# Patient Record
Sex: Female | Born: 1990 | Race: Black or African American | Hispanic: No | Marital: Single | Smoking: Never smoker
Health system: Southern US, Community
[De-identification: ages and names within clinical notes are randomized; demographics above are authoritative.]

## PROBLEM LIST (undated history)

## (undated) DIAGNOSIS — O139 Gestational [pregnancy-induced] hypertension without significant proteinuria, unspecified trimester: Secondary | ICD-10-CM

## (undated) DIAGNOSIS — D649 Anemia, unspecified: Secondary | ICD-10-CM

## (undated) HISTORY — PX: NO PAST SURGERIES: SHX2092

## (undated) HISTORY — DX: Gestational (pregnancy-induced) hypertension without significant proteinuria, unspecified trimester: O13.9

---

## 2018-01-18 ENCOUNTER — Ambulatory Visit (HOSPITAL_COMMUNITY)
Admission: EM | Admit: 2018-01-18 | Discharge: 2018-01-18 | Disposition: A | Discharge status: Home / Self Care | Payer: Self-pay | Attending: Family Medicine | Admitting: Family Medicine

## 2018-01-18 ENCOUNTER — Encounter (HOSPITAL_COMMUNITY): Payer: Self-pay | Admitting: Family Medicine

## 2018-01-18 DIAGNOSIS — K0889 Other specified disorders of teeth and supporting structures: Secondary | ICD-10-CM

## 2018-01-18 HISTORY — DX: Anemia, unspecified: D64.9

## 2018-01-18 MED ORDER — AMOXICILLIN 875 MG PO TABS: 875.0000 mg | ORAL_TABLET | Freq: Two times a day (BID) | ORAL | 0 refills | Status: AC

## 2018-01-18 NOTE — Discharge Instructions (Signed)
You may use over the counter ibuprofen or acetaminophen as needed.  ° °

## 2018-01-18 NOTE — ED Triage Notes (Signed)
Pt here for dental pain and oral swelling.

## 2018-01-23 NOTE — ED Provider Notes (Signed)
  [facility]   [id number] 01/18/18 Arrival Time: 1627  ASSESSMENT & PLAN:  1. Pain, dental     Meds ordered this encounter  Medications  . amoxicillin (AMOXIL) 875 MG tablet    Sig: Take 1 tablet (875 mg total) by mouth 2 (two) times daily for 10 days.    Dispense:  20 tablet    Refill:  0   Prefers OTC analgesics. Dental resource written instructions given. She will schedule dental evaluation as soon as possible.  Reviewed expectations re: course of current medical issues. Questions answered. Outlined signs and symptoms indicating need for more acute intervention. Patient verbalized understanding. After Visit Summary given.   SUBJECTIVE:  Courtney Lyons is a 27 y.o. female who reports gradual onset of left lower dental pain described as throbbing. Present for several days. Afebrile. Tolerating PO intake but reports pain with chewing. Normal swallowing. She does not see a dentist regularly. No neck swelling or pain. OTC analgesics without relief.  ROS: As per HPI.  OBJECTIVE:  Vitals:   01/18/18 1645 01/18/18 1646  BP:  135/83  Pulse: 90   Resp: 18   Temp: 98 F (36.7 C)   TempSrc: Oral   SpO2: 100%     General appearance: alert; no distress HENT: normocephalic; atraumatic; dentition: fair; gingival hypertrophy over left lower gums without areas of fluctuance; tender to touch though; dental caries Neck: supple without LAD Lungs: normal respirations Skin: warm and dry Psychological: alert and cooperative; normal mood and affect  No Known Allergies  Past Medical History:  Diagnosis Date  . Anemia    Social History   Socioeconomic History  . Marital status: Single    Spouse name: Not on file  . Number of children: Not on file  . Years of education: Not on file  . Highest education level: Not on file  Occupational History  . Not on file  Social Needs  . Financial resource strain: Not on file  . Food insecurity:    Worry: Not on file   Inability: Not on file  . Transportation needs:    Medical: Not on file    Non-medical: Not on file  Tobacco Use  . Smoking status: Never Smoker  Substance and Sexual Activity  . Alcohol use: Not on file  . Drug use: Not on file  . Sexual activity: Not on file  Lifestyle  . Physical activity:    Days per week: Not on file    Minutes per session: Not on file  . Stress: Not on file  Relationships  . Social connections:    Talks on phone: Not on file    Gets together: Not on file    Attends religious service: Not on file    Active member of club or organization: Not on file    Attends meetings of clubs or organizations: Not on file    Relationship status: Not on file  . Intimate partner violence:    Fear of current or ex partner: Not on file    Emotionally abused: Not on file    Physically abused: Not on file    Forced sexual activity: Not on file  Other Topics Concern  . Not on file  Social History Narrative  . Not on file   History reviewed. No pertinent family history. History reviewed. No pertinent surgical history.   Courtney Lyons, Courtney Mccormack, MD 01/23/18 [phone]

## 2018-04-30 ENCOUNTER — Encounter (HOSPITAL_COMMUNITY): Payer: Self-pay | Admitting: Emergency Medicine

## 2018-04-30 ENCOUNTER — Ambulatory Visit (HOSPITAL_COMMUNITY)
Admission: EM | Admit: 2018-04-30 | Discharge: 2018-04-30 | Disposition: A | Discharge status: Home / Self Care | Payer: Medicaid Other | Attending: Family Medicine | Admitting: Family Medicine

## 2018-04-30 ENCOUNTER — Other Ambulatory Visit: Payer: Self-pay

## 2018-04-30 DIAGNOSIS — J020 Streptococcal pharyngitis: Secondary | ICD-10-CM | POA: Diagnosis not present

## 2018-04-30 LAB — POCT RAPID STREP A: Streptococcus, Group A Screen (Direct): POSITIVE — AB

## 2018-04-30 MED ORDER — IPRATROPIUM BROMIDE 0.06 % NA SOLN: 2.0000 | Freq: Four times a day (QID) | NASAL | 0 refills | Status: AC

## 2018-04-30 MED ORDER — FLUTICASONE PROPIONATE 50 MCG/ACT NA SUSP: 2.0000 | Freq: Every day | NASAL | 0 refills | Status: AC

## 2018-04-30 MED ORDER — PENICILLIN G BENZATHINE 1200000 UNIT/2ML IM SUSP
INTRAMUSCULAR | Status: AC
Start: 2018-04-30 — End: ?
  Filled 2018-04-30: qty 2

## 2018-04-30 MED ORDER — LIDOCAINE VISCOUS HCL 2 % MT SOLN: 15.0000 mL | OROMUCOSAL | 0 refills | Status: DC | PRN

## 2018-04-30 MED ORDER — PENICILLIN G BENZATHINE 1200000 UNIT/2ML IM SUSP
1.2000 10*6.[IU] | Freq: Once | INTRAMUSCULAR | Status: AC
Start: 2018-04-30 — End: 2018-04-30
  Administered 2018-04-30: 1.2 10*6.[IU] via INTRAMUSCULAR

## 2018-04-30 NOTE — Discharge Instructions (Addendum)
Rapid strep positive. Bicillin injection in office today. Start lidocaine for sore throat, do not eat or drink for the next 40 mins after use as it can stunt your gag reflex. If you are having nasal congestion/drainage, can try Flonase, atrovent. You can use over the counter nasal saline rinse such as neti pot for nasal congestion. Monitor for any worsening of symptoms, swelling of the throat, trouble breathing, trouble swallowing, leaning forward to breath, drooling, go to the emergency department for further evaluation needed.

## 2018-04-30 NOTE — ED Provider Notes (Signed)
[facility]    CSN: [id number] Arrival date & time: 04/30/18  4098     History   Chief Complaint Chief Complaint  Patient presents with  . Sore Throat    HPI Berenice Sacra is a 27 y.o. female.   27 year old female comes in for 2-day history of sore throat.  States had similar symptoms 1.5 weeks ago with sore throat, rhinorrhea, nasal congestion, cough, body aches.  At that time tried home remedies including tea, soup, ice, gurgling with hydrogen peroxide with good relief. All symptoms resolved 4 days ago prior to current symptom onset.  Denies current rhinorrhea, nasal congestion, cough.  Denies fever, chills, night sweats.  States sore throat has been more painful, having trouble eating and drinking due to the pain.  Denies tripoding, drooling, trismus.  States tonsils at baseline are large, but has felt more swollen than usual.  Denies abdominal pain, nausea, vomiting.  Tried aspirin without relief.     Past Medical History:  Diagnosis Date  . Anemia     There are no active problems to display for this patient.   History reviewed. No pertinent surgical history.  OB History   None      Home Medications    Prior to Admission medications   Medication Sig Start Date End Date Taking? Authorizing Provider  aspirin 325 MG tablet Take 325 mg by mouth daily.   Yes [provider]  fluticasone (FLONASE) 50 MCG/ACT nasal spray Place 2 sprays into both nostrils daily. 04/30/18   Cathie Hoops, Camillia Marcy V, PA-C  ipratropium (ATROVENT) 0.06 % nasal spray Place 2 sprays into both nostrils 4 (four) times daily. 04/30/18   Cathie Hoops, Bravlio Luca V, PA-C  lidocaine (XYLOCAINE) 2 % solution Use as directed 15 mLs in the mouth or throat as needed for mouth pain. 04/30/18   Belinda Fisher, PA-C    Family History Family History  Problem Relation Age of Onset  . Hypertension Mother   . Diabetes Father     Social History Social History   Tobacco Use  . Smoking status: Never Smoker  Substance Use  Topics  . Alcohol use: Yes  . Drug use: Yes    Types: Marijuana     Allergies   Patient has no known allergies.   Review of Systems Review of Systems  Reason unable to perform ROS: See HPI as above.     Physical Exam Triage Vital Signs ED Triage Vitals  Enc Vitals Group     BP 04/30/18 0949 109/82     Pulse Rate 04/30/18 0949 78     Resp 04/30/18 0949 16     Temp 04/30/18 0949 98.2 F (36.8 C)     Temp Source 04/30/18 0949 Oral     SpO2 04/30/18 0949 100 %     Weight --      Height --      Head Circumference --      Peak Flow --      Pain Score 04/30/18 0956 10     Pain Loc --      Pain Edu? --      Excl. in GC? --    No data found.  Updated Vital Signs BP 109/82 (BP Location: Right Arm)   Pulse 78   Temp 98.2 F (36.8 C) (Oral)   Resp 16   LMP 04/22/2018   SpO2 100%   Physical Exam  Constitutional: She is oriented to person, place, and time. She appears well-developed and  well-nourished.  Non-toxic appearance. She does not appear ill. No distress.  HENT:  Head: Normocephalic and atraumatic.  Right Ear: Tympanic membrane, external ear and ear canal normal. Tympanic membrane is not erythematous and not bulging.  Left Ear: Tympanic membrane, external ear and ear canal normal. Tympanic membrane is not erythematous and not bulging.  Nose: Nose normal. Right sinus exhibits no maxillary sinus tenderness and no frontal sinus tenderness. Left sinus exhibits no maxillary sinus tenderness and no frontal sinus tenderness.  Mouth/Throat: Uvula is midline, oropharynx is clear and moist and mucous membranes are normal. Tonsils are 3+ on the right. Tonsils are 3+ on the left. No tonsillar exudate.  Eyes: Pupils are equal, round, and reactive to light. Conjunctivae are normal.  Neck: Normal range of motion. Neck supple.  Cardiovascular: Normal rate, regular rhythm and normal heart sounds. Exam reveals no gallop and no friction rub.  No murmur heard. Pulmonary/Chest: Effort  normal and breath sounds normal. She has no decreased breath sounds. She has no wheezes. She has no rhonchi. She has no rales.  Lymphadenopathy:    She has no cervical adenopathy.  Neurological: She is alert and oriented to person, place, and time.  Skin: Skin is warm and dry.  Psychiatric: She has a normal mood and affect. Her behavior is normal. Judgment normal.     UC Treatments / Results  Labs (all labs ordered are listed, but only abnormal results are displayed) Labs Reviewed  POCT RAPID STREP A - Abnormal; Notable for the following components:      Result Value   Streptococcus, Group A Screen (Direct) POSITIVE (*)    All other components within normal limits    EKG None  Radiology No results found.  Procedures Procedures (including critical care time)  Medications Ordered in UC Medications  penicillin g benzathine (BICILLIN LA) 1200000 UNIT/2ML injection 1.2 Million Units (1.2 Million Units Intramuscular Given 04/30/18 1124)    Initial Impression / Assessment and Plan / UC Course  I have reviewed the triage vital signs and the nursing notes.  Pertinent labs & imaging results that were available during my care of the patient were reviewed by me and considered in my medical decision making (see chart for details).    Rapid strep positive. Given appearance of tonsils, offered bicillin injection, which paitient would like to proceed vs PO abx. Symptomatic treatment as needed. Return precautions given.   Final Clinical Impressions(s) / UC Diagnoses   Final diagnoses:  Strep pharyngitis    ED Prescriptions    Medication Sig Dispense Auth. Provider   lidocaine (XYLOCAINE) 2 % solution Use as directed 15 mLs in the mouth or throat as needed for mouth pain. 100 mL Stormy Connon V, PA-C   fluticasone (FLONASE) 50 MCG/ACT nasal spray Place 2 sprays into both nostrils daily. 1 g Fern Asmar V, PA-C   ipratropium (ATROVENT) 0.06 % nasal spray Place 2 sprays into both nostrils 4  (four) times daily. 15 mL Threasa AlphaYu, Zelig Gacek V, PA-C        Natalina Wieting V, [place] JerseyPA-C 04/30/18 1210

## 2018-04-30 NOTE — ED Triage Notes (Signed)
Sore throat started 1 1/2 ago.  Sore throat went away.  Then sore throat returned 2 days ago.  Denies cough, cold runny nose today.  Says she does not feel sick today, just throat hurts

## 2018-08-20 ENCOUNTER — Encounter (HOSPITAL_COMMUNITY): Payer: Self-pay | Admitting: Emergency Medicine

## 2018-08-20 ENCOUNTER — Emergency Department (HOSPITAL_COMMUNITY)
Admission: EM | Admit: 2018-08-20 | Discharge: 2018-08-20 | Disposition: A | Discharge status: Home / Self Care | Payer: Medicaid Other | Attending: Emergency Medicine | Admitting: Emergency Medicine

## 2018-08-20 ENCOUNTER — Ambulatory Visit (HOSPITAL_COMMUNITY): Payer: Medicaid Other

## 2018-08-20 DIAGNOSIS — O26891 Other specified pregnancy related conditions, first trimester: Secondary | ICD-10-CM | POA: Diagnosis not present

## 2018-08-20 DIAGNOSIS — R102 Pelvic and perineal pain: Secondary | ICD-10-CM | POA: Insufficient documentation

## 2018-08-20 DIAGNOSIS — Z7982 Long term (current) use of aspirin: Secondary | ICD-10-CM | POA: Diagnosis not present

## 2018-08-20 DIAGNOSIS — Z3A13 13 weeks gestation of pregnancy: Secondary | ICD-10-CM | POA: Diagnosis not present

## 2018-08-20 DIAGNOSIS — Z79899 Other long term (current) drug therapy: Secondary | ICD-10-CM | POA: Diagnosis not present

## 2018-08-20 LAB — WET PREP, GENITAL
Sperm: NONE SEEN
Trich, Wet Prep: NONE SEEN
Yeast Wet Prep HPF POC: NONE SEEN

## 2018-08-20 LAB — I-STAT BETA HCG BLOOD, ED (MC, WL, AP ONLY): I-stat hCG, quantitative: >2000 m[IU]/mL — ABNORMAL HIGH (ref <5)

## 2018-08-20 MED ORDER — PRENATAL COMPLETE 14-0.4 MG PO TABS: 1.0000 | ORAL_TABLET | Freq: Every day | ORAL | 0 refills | Status: DC

## 2018-08-20 NOTE — ED Triage Notes (Signed)
Patient to ER for evaluation of abdominal pain in pregnancy, reports she is pregnant, not sure how far along, LMP was August 6th.

## 2018-08-20 NOTE — ED Notes (Signed)
ED Provider at bedside. 

## 2018-08-20 NOTE — ED Notes (Signed)
Patient transported to US 

## 2018-08-20 NOTE — Discharge Instructions (Addendum)
Please read attached information. If you experience any new or worsening signs or symptoms please return to the emergency room for evaluation. Please follow-up with your primary care provider or specialist as discussed. Please use medication prescribed only as directed and discontinue taking if you have any concerning signs or symptoms.   °

## 2018-08-20 NOTE — ED Provider Notes (Signed)
[facility] EMERGENCY DEPARTMENT Provider Note   CSN: [id number] Arrival date & time: 08/20/18  1251   History   Chief Complaint Chief Complaint  Patient presents with  . Abdominal Pain    HPI Courtney Lyons is a 27 y.o. female.  HPI   27 YOF G17 P2 A15 femeale presents with complaints of pregnancy and pelvic pain.  Patient notes that her last menstrual cycle was on 05/22/2018.  She notes pain today in the bilateral lower pelvis, sharp in nature.  She denies any vaginal bleeding or discharge.  She denies any fever.  Past Medical History:  Diagnosis Date  . Anemia     There are no active problems to display for this patient.   History reviewed. No pertinent surgical history.   OB History   None      Home Medications    Prior to Admission medications   Medication Sig Start Date End Date Taking? Authorizing Provider  aspirin 325 MG tablet Take 325 mg by mouth daily.    [provider]  fluticasone (FLONASE) 50 MCG/ACT nasal spray Place 2 sprays into both nostrils daily. 04/30/18   Cathie Hoops, Amy V, PA-C  ipratropium (ATROVENT) 0.06 % nasal spray Place 2 sprays into both nostrils 4 (four) times daily. 04/30/18   Cathie Hoops, Amy V, PA-C  lidocaine (XYLOCAINE) 2 % solution Use as directed 15 mLs in the mouth or throat as needed for mouth pain. 04/30/18   Belinda Fisher, PA-C  Prenatal Vit-Fe Fumarate-FA (PRENATAL COMPLETE) 14-0.4 MG TABS Take 1 tablet by mouth daily. 08/20/18   Eyvonne Mechanic, PA-C    Family History Family History  Problem Relation Age of Onset  . Hypertension Mother   . Diabetes Father     Social History Social History   Tobacco Use  . Smoking status: Never Smoker  . Smokeless tobacco: Never Used  Substance Use Topics  . Alcohol use: Yes  . Drug use: Yes    Types: Marijuana     Allergies   Patient has no known allergies.   Review of Systems Review of Systems  All other systems reviewed and are negative.  Physical Exam Updated  Vital Signs BP (!) 145/68 (BP Location: Right Arm)   Pulse 79   Temp 99.5 F (37.5 C)   Resp 18   LMP 05/22/2018   SpO2 100%   Physical Exam  Constitutional: She is oriented to person, place, and time. She appears well-developed and well-nourished.  HENT:  Head: Normocephalic and atraumatic.  Eyes: Pupils are equal, round, and reactive to light. Conjunctivae are normal. Right eye exhibits no discharge. Left eye exhibits no discharge. No scleral icterus.  Neck: Normal range of motion. No JVD present. No tracheal deviation present.  Pulmonary/Chest: Effort normal. No stridor.  Abdominal: Soft. She exhibits no distension. There is no tenderness. There is no guarding.  Genitourinary:  Genitourinary Comments: No discharge noted in vaginal vault-cervical os closed  Neurological: She is alert and oriented to person, place, and time. Coordination normal.  Psychiatric: She has a normal mood and affect. Her behavior is normal. Judgment and thought content normal.  Nursing note and vitals reviewed.    ED Treatments / Results  Labs (all labs ordered are listed, but only abnormal results are displayed) Labs Reviewed  WET PREP, GENITAL - Abnormal; Notable for the following components:      Result Value   Clue Cells Wet Prep HPF POC PRESENT (*)    WBC, Wet Prep  HPF POC MANY (*)    All other components within normal limits  I-STAT BETA HCG BLOOD, ED ([facility], [facility], [facility] ONLY) - Abnormal; Notable for the following components:   I-stat hCG, quantitative >2,000.0 (*)    All other components within normal limits  GC/CHLAMYDIA PROBE AMP () NOT AT [facility]    EKG None  Radiology US Ob Comp < 14 Wks  Result Date: 08/20/2018 CLINICAL DATA:  Initial evaluation for acute pelvic pain, early pregnancy. EXAM: OBSTETRIC <14 WK [place] AND TRANSVAGINAL OB US TECHNIQUE: Both transabdominal and transvaginal ultrasound examinations were performed for complete evaluation of the gestation as well as the maternal  uterus, adnexal regions, and pelvic cul-de-sac. Transvaginal technique was performed to assess early pregnancy. COMPARISON:  None. FINDINGS: Intrauterine gestational sac: Single Yolk sac:  Present Embryo:  Present Cardiac Activity: Present Heart Rate: 163 bpm CRL: 68 mm   13 w   0 d                  [place] EDC: 02/25/2019 Subchorionic hemorrhage:  None visualized. Maternal uterus/adnexae: Ovaries are normal in appearance bilaterally. No adnexal mass. No free fluid identified. IMPRESSION: 1. Single live intrauterine pregnancy as above without complication, estimated gestational age [redacted] weeks and 0 days by crown-rump length, with ultrasound EDC of 02/25/2019. 2. No other acute maternal uterine or adnexal abnormality identified. Electronically Signed   By: Rise Mu M.D.   On: 08/20/2018 16:53   US Ob Transvaginal  Result Date: 08/20/2018 CLINICAL DATA:  Initial evaluation for acute pelvic pain, early pregnancy. EXAM: OBSTETRIC <14 WK [place] AND TRANSVAGINAL OB US TECHNIQUE: Both transabdominal and transvaginal ultrasound examinations were performed for complete evaluation of the gestation as well as the maternal uterus, adnexal regions, and pelvic cul-de-sac. Transvaginal technique was performed to assess early pregnancy. COMPARISON:  None. FINDINGS: Intrauterine gestational sac: Single Yolk sac:  Present Embryo:  Present Cardiac Activity: Present Heart Rate: 163 bpm CRL: 68 mm   13 w   0 d                  [place] EDC: 02/25/2019 Subchorionic hemorrhage:  None visualized. Maternal uterus/adnexae: Ovaries are normal in appearance bilaterally. No adnexal mass. No free fluid identified. IMPRESSION: 1. Single live intrauterine pregnancy as above without complication, estimated gestational age [redacted] weeks and 0 days by crown-rump length, with ultrasound EDC of 02/25/2019. 2. No other acute maternal uterine or adnexal abnormality identified. Electronically Signed   By: Rise Mu M.D.   On: 08/20/2018 16:53     Procedures Procedures (including critical care time)  Medications Ordered in ED Medications - No data to display   Initial Impression / Assessment and Plan / ED Course  I have reviewed the triage vital signs and the nursing notes.  Pertinent labs & imaging results that were available during my care of the patient were reviewed by me and considered in my medical decision making (see chart for details).     Labs: Wet prep, i-STAT beta-hCG  Imaging: Ultrasound OB complete  Consults:  Therapeutics:  Discharge Meds: Prenatal vitamins  Assessment/Plan: 27 year old female presents today with pregnancy at approximately 13 weeks.  No comp gating features.  Patient encouraged to follow-up closely with OB/GYN, strict return precautions given.  Verbalized understanding and agreement to today's plan had no further questions or concerns.   Final Clinical Impressions(s) / ED Diagnoses   Final diagnoses:  [redacted] weeks gestation of pregnancy    ED Discharge Orders  Ordered    Prenatal Vit-Fe Fumarate-FA (PRENATAL COMPLETE) 14-0.4 MG TABS  Daily     08/20/18 1807           Eyvonne Mechanic, PA-C 08/20/18 1812    Tilden Fossa, MD 08/21/18 1421

## 2018-08-21 LAB — GC/CHLAMYDIA PROBE AMP (~~LOC~~) NOT AT ARMC
Chlamydia: NEGATIVE
Neisseria Gonorrhea: POSITIVE — AB

## 2018-09-06 ENCOUNTER — Ambulatory Visit (INDEPENDENT_AMBULATORY_CARE_PROVIDER_SITE_OTHER): Payer: Medicaid Other | Admitting: Certified Nurse Midwife

## 2018-09-06 ENCOUNTER — Other Ambulatory Visit (HOSPITAL_COMMUNITY)
Admission: RE | Admit: 2018-09-06 | Discharge: 2018-09-06 | Disposition: A | Discharge status: Home / Self Care | Payer: Medicaid Other | Source: Ambulatory Visit | Attending: Obstetrics | Admitting: Obstetrics

## 2018-09-06 ENCOUNTER — Encounter: Payer: Self-pay | Admitting: Certified Nurse Midwife

## 2018-09-06 ENCOUNTER — Other Ambulatory Visit: Payer: Self-pay

## 2018-09-06 ENCOUNTER — Encounter: Payer: Medicaid Other | Admitting: Obstetrics

## 2018-09-06 VITALS — BP 125/73 | HR 80 | Temp 97.9°F | Ht 64.0 in | Wt 213.8 lb

## 2018-09-06 DIAGNOSIS — Z3480 Encounter for supervision of other normal pregnancy, unspecified trimester: Secondary | ICD-10-CM | POA: Diagnosis present

## 2018-09-06 DIAGNOSIS — B9689 Other specified bacterial agents as the cause of diseases classified elsewhere: Secondary | ICD-10-CM

## 2018-09-06 DIAGNOSIS — O98211 Gonorrhea complicating pregnancy, first trimester: Secondary | ICD-10-CM

## 2018-09-06 DIAGNOSIS — N760 Acute vaginitis: Secondary | ICD-10-CM

## 2018-09-06 DIAGNOSIS — O99210 Obesity complicating pregnancy, unspecified trimester: Secondary | ICD-10-CM

## 2018-09-06 DIAGNOSIS — B373 Candidiasis of vulva and vagina: Secondary | ICD-10-CM

## 2018-09-06 DIAGNOSIS — O99019 Anemia complicating pregnancy, unspecified trimester: Secondary | ICD-10-CM

## 2018-09-06 DIAGNOSIS — B3731 Acute candidiasis of vulva and vagina: Secondary | ICD-10-CM

## 2018-09-06 DIAGNOSIS — D509 Iron deficiency anemia, unspecified: Secondary | ICD-10-CM

## 2018-09-06 DIAGNOSIS — Z3481 Encounter for supervision of other normal pregnancy, first trimester: Secondary | ICD-10-CM

## 2018-09-06 HISTORY — DX: Gonorrhea complicating pregnancy, first trimester: O98.211

## 2018-09-06 MED ORDER — PROVIDA DHA 16-16-1.25-110 MG PO CAPS: 1.0000 | ORAL_CAPSULE | Freq: Every day | ORAL | 8 refills | Status: DC

## 2018-09-06 MED ORDER — ASPIRIN EC 81 MG PO TBEC
81.0000 mg | DELAYED_RELEASE_TABLET | Freq: Every day | ORAL | 2 refills | Status: DC
Start: 2018-09-06 — End: 2018-11-29

## 2018-09-06 NOTE — Progress Notes (Signed)
NEW OB.  Needs a Vegan PNV. She currently living in a shelter.

## 2018-09-06 NOTE — Progress Notes (Signed)
Subjective:   Courtney Lyons is a 27 y.o. [id number] at 7110w2d by LMP, confirmed with 13 weeks US being seen today for her first obstetrical visit.  Her obstetrical history is significant for obesity. Patient does intend to breast feed. Pregnancy history fully reviewed.  Patient reports no complaints. She reports 14 TAB, 13 over the course of 2009-2017, and 1 TAB in 2018  HISTORY: OB History  Gravida Para Term Preterm AB Living  17 2 2  0 14 2  SAB TAB Ectopic Multiple Live Births  0 14 0 0 2    # Outcome Date GA Lbr Len/2nd Weight Sex Delivery Anes PTL Lv  17 Current           16 Term 02/24/16    M Vag-Spont   LIV  15 Term 04/05/08    F Vag-Spont   LIV  14 TAB           13 TAB           12 TAB           11 TAB           10 TAB           9 TAB           8 TAB           7 TAB           6 TAB           5 TAB           4 TAB           3 TAB           2 TAB           1 TAB             Last pap smear was done 2016 and was normal per patient   Past Medical History:  Diagnosis Date  . Anemia   . Pregnancy induced hypertension    History reviewed. No pertinent surgical history. Family History  Problem Relation Age of Onset  . Hypertension Mother   . Stroke Mother   . Diabetes Father   . Birth defects Brother   . Asthma Daughter   . Cancer Paternal Aunt   . Early death Paternal Aunt   . Vision loss Paternal Grandfather    Social History   Tobacco Use  . Smoking status: Never Smoker  . Smokeless tobacco: Never Used  Substance Use Topics  . Alcohol use: Yes  . Drug use: Not Currently    Types: Marijuana    Comment: stopped Aug/2019   No Known Allergies Current Outpatient Medications on File Prior to Visit  Medication Sig Dispense Refill  . Prenatal Vit-Fe Fumarate-FA (PRENATAL COMPLETE) 14-0.4 MG TABS Take 1 tablet by mouth daily. 60 each 0  . aspirin 325 MG tablet Take 325 mg by mouth daily.    . fluticasone (FLONASE) 50 MCG/ACT nasal spray Place 2 sprays  into both nostrils daily. (Patient not taking: Reported on 09/06/2018) 1 g 0  . ipratropium (ATROVENT) 0.06 % nasal spray Place 2 sprays into both nostrils 4 (four) times daily. (Patient not taking: Reported on 09/06/2018) 15 mL 0  . lidocaine (XYLOCAINE) 2 % solution Use as directed 15 mLs in the mouth or throat as needed for mouth pain. (Patient not taking: Reported on 09/06/2018) 100 mL 0   No current facility-administered medications on file  prior to visit.     Review of Systems Pertinent items noted in HPI and remainder of comprehensive ROS otherwise negative.  Exam   Vitals:   09/06/18 1446 09/06/18 1459  BP: 125/73   Pulse: 80   Temp: 97.9 F (36.6 C)   Weight: 213 lb 12.8 oz (97 kg)   Height:  5\' 4"  (1.626 m)   Fetal Heart Rate (bpm): 152  Pelvic Exam: Perineum: no hemorrhoids, normal perineum   Vulva: normal external genitalia, no lesions   Vagina:  normal mucosa, normal discharge   Cervix: no lesions and normal, pap smear done.    Adnexa: normal adnexa and no mass, fullness, tenderness   Bony Pelvis: average  System: General: well-developed, well-nourished female in no acute distress   Breasts:  normal appearance, no masses or tenderness bilaterally   Skin: normal coloration and turgor, no rashes   Neurologic: oriented, normal, negative, normal mood   Extremities: normal strength, tone, and muscle mass, ROM of all joints is normal   HEENT PERRLA, extraocular movement intact and sclera clear   Mouth/Teeth mucous membranes moist, pharynx normal without lesions and dental hygiene good   Neck supple and no masses   Cardiovascular: regular rate and rhythm   Respiratory:  no respiratory distress, normal breath sounds   Abdomen: soft, non-tender; bowel sounds normal; no masses,  no organomegaly   Assessment:   Pregnancy: [id number] Patient Active Problem List   Diagnosis Date Noted  . Supervision of other normal pregnancy, antepartum 09/06/2018  . Obesity during  pregnancy, antepartum 09/06/2018  . Gonorrhea of mother during pregnancy, first trimester 09/06/2018     Plan:  1. Supervision of other normal pregnancy, antepartum - Patient doing well, no complaints  - Anticipatory guidance on upcoming appointments - Aspirin for prevention of preeclampsia ordered  - Patient request vegan PNV  - Obstetric Panel, Including HIV - Culture, OB Urine - Genetic Screening - Hemoglobinopathy evaluation - Cystic Fibrosis Mutation 97 - SMN1 COPY NUMBER ANALYSIS (SMA Carrier Screen) - Cytology - PAP - Enroll Patient in Babyscripts - AFP, Serum, Open Spina Bifida - [place] MFM OB DETAIL +14 WK; Future - Prenat-FeFum-FePo-FA-DHA w/o A (PROVIDA DHA) 16-16-1.25-110 MG CAPS; Take 1 tablet by mouth daily.  Dispense: 30 capsule; Refill: 8  2. Gonorrhea during pregnancy, first trimester  - Positive on 11/4, treated on 11/7  - She denies IC since treatment   Initial labs drawn. Continue prenatal vitamins. Genetic Screening discussed, AFP and NIPS: ordered. Ultrasound discussed; fetal anatomic survey: ordered. Problem list reviewed and updated. The nature of [facility] with multiple MDs and other Advanced Practice Providers was explained to patient; also emphasized that residents, students are part of our team. Routine obstetric precautions reviewed. Return in about 4 weeks (around 10/04/2018) for ROB.     Sharyon Cable, CNM [facility] [employer], [facility]

## 2018-09-07 ENCOUNTER — Other Ambulatory Visit: Payer: Self-pay

## 2018-09-07 MED ORDER — PROVIDA OB 20-20-1.25 MG PO CAPS: 1.0000 | ORAL_CAPSULE | ORAL | 11 refills | Status: AC

## 2018-09-07 NOTE — Progress Notes (Signed)
[facility] called and Provida DHA isnt available anymore, asked me to send rx for Provida OB.

## 2018-09-10 LAB — CYTOLOGY - PAP
Bacterial vaginitis: POSITIVE — AB
Candida vaginitis: POSITIVE — AB
Chlamydia: NEGATIVE
Diagnosis: NEGATIVE
Neisseria Gonorrhea: NEGATIVE
Trichomonas: NEGATIVE

## 2018-09-10 LAB — CULTURE, OB URINE

## 2018-09-10 LAB — URINE CULTURE, OB REFLEX: Organism ID, Bacteria: NO GROWTH

## 2018-09-11 MED ORDER — METRONIDAZOLE 500 MG PO TABS: 500.0000 mg | ORAL_TABLET | Freq: Two times a day (BID) | ORAL | 0 refills | Status: DC

## 2018-09-11 MED ORDER — TERCONAZOLE 0.8 % VA CREA: 1.0000 | TOPICAL_CREAM | Freq: Every day | VAGINAL | 0 refills | Status: DC

## 2018-09-11 NOTE — Addendum Note (Signed)
Addended by: Sharyon CableOGERS, Tamber Burtch C on: 09/11/2018 08:13 PM   Modules accepted: Orders

## 2018-09-12 LAB — OBSTETRIC PANEL, INCLUDING HIV
Antibody Screen: NEGATIVE
Basophils Absolute: 0 10*3/uL (ref 0.0–0.2)
Basos: 1 %
EOS (ABSOLUTE): 0.1 10*3/uL (ref 0.0–0.4)
Eos: 2 %
HIV Screen 4th Generation wRfx: NONREACTIVE
Hematocrit: 29.9 % — ABNORMAL LOW (ref 34.0–46.6)
Hemoglobin: 9.5 g/dL — ABNORMAL LOW (ref 11.1–15.9)
Hepatitis B Surface Ag: NEGATIVE
Immature Grans (Abs): 0 10*3/uL (ref 0.0–0.1)
Immature Granulocytes: 0 %
Lymphocytes Absolute: 1.8 10*3/uL (ref 0.7–3.1)
Lymphs: 29 %
MCH: 24.2 pg — ABNORMAL LOW (ref 26.6–33.0)
MCHC: 31.8 g/dL (ref 31.5–35.7)
MCV: 76 fL — ABNORMAL LOW (ref 79–97)
Monocytes Absolute: 0.5 10*3/uL (ref 0.1–0.9)
Monocytes: 7 %
Neutrophils Absolute: 3.7 10*3/uL (ref 1.4–7.0)
Neutrophils: 61 %
Platelets: 349 10*3/uL (ref 150–450)
RBC: 3.93 x10E6/uL (ref 3.77–5.28)
RDW: 18.7 % — ABNORMAL HIGH (ref 12.3–15.4)
RPR Ser Ql: NONREACTIVE
Rh Factor: POSITIVE
Rubella Antibodies, IGG: 3.5 index (ref >0.99)
WBC: 6.2 10*3/uL (ref 3.4–10.8)

## 2018-09-12 LAB — HEMOGLOBINOPATHY EVALUATION
HGB C: 0 %
HGB S: 0 %
HGB VARIANT: 0 %
Hemoglobin A2 Quantitation: 2.3 % (ref 1.8–3.2)
Hemoglobin F Quantitation: 0 % (ref 0.0–2.0)
Hgb A: 97.7 % (ref 96.4–98.8)

## 2018-09-12 MED ORDER — FERROUS SULFATE 325 (65 FE) MG PO TABS: 325.0000 mg | ORAL_TABLET | Freq: Every day | ORAL | 1 refills | Status: DC

## 2018-09-12 NOTE — Addendum Note (Signed)
Addended by: Sharyon CableOGERS, Yosselyn Tax C on: 09/12/2018 07:01 AM   Modules accepted: Orders

## 2018-09-15 LAB — AFP, SERUM, OPEN SPINA BIFIDA
AFP MoM: 0.65
AFP Value: 17.1 ng/mL
Gest. Age on Collection Date: 15.2 weeks
Maternal Age At EDD: 29.9 yr
OSBR Risk 1 IN: 10000
Test Results:: NEGATIVE
Weight: 214 [lb_av]

## 2018-09-15 LAB — SMN1 COPY NUMBER ANALYSIS (SMA CARRIER SCREENING)

## 2018-09-15 LAB — CYSTIC FIBROSIS MUTATION 97: Interpretation: NOT DETECTED

## 2018-09-24 ENCOUNTER — Encounter (HOSPITAL_COMMUNITY): Payer: Self-pay

## 2018-10-02 ENCOUNTER — Ambulatory Visit (HOSPITAL_COMMUNITY)
Admission: RE | Admit: 2018-10-02 | Discharge: 2018-10-02 | Disposition: A | Discharge status: Home / Self Care | Payer: Medicaid Other | Source: Ambulatory Visit | Attending: Certified Nurse Midwife | Admitting: Certified Nurse Midwife

## 2018-10-02 DIAGNOSIS — O99212 Obesity complicating pregnancy, second trimester: Secondary | ICD-10-CM | POA: Insufficient documentation

## 2018-10-02 DIAGNOSIS — O09292 Supervision of pregnancy with other poor reproductive or obstetric history, second trimester: Secondary | ICD-10-CM | POA: Diagnosis not present

## 2018-10-02 DIAGNOSIS — Z363 Encounter for antenatal screening for malformations: Secondary | ICD-10-CM

## 2018-10-02 DIAGNOSIS — Z3A19 19 weeks gestation of pregnancy: Secondary | ICD-10-CM | POA: Insufficient documentation

## 2018-10-02 DIAGNOSIS — Z3480 Encounter for supervision of other normal pregnancy, unspecified trimester: Secondary | ICD-10-CM

## 2018-10-04 ENCOUNTER — Ambulatory Visit (INDEPENDENT_AMBULATORY_CARE_PROVIDER_SITE_OTHER): Payer: Medicaid Other | Admitting: Certified Nurse Midwife

## 2018-10-04 ENCOUNTER — Other Ambulatory Visit: Payer: Self-pay

## 2018-10-04 ENCOUNTER — Encounter: Payer: Self-pay | Admitting: Certified Nurse Midwife

## 2018-10-04 VITALS — BP 128/74 | HR 73 | Wt 221.0 lb

## 2018-10-04 DIAGNOSIS — O98212 Gonorrhea complicating pregnancy, second trimester: Secondary | ICD-10-CM

## 2018-10-04 DIAGNOSIS — O99210 Obesity complicating pregnancy, unspecified trimester: Secondary | ICD-10-CM

## 2018-10-04 DIAGNOSIS — Z3480 Encounter for supervision of other normal pregnancy, unspecified trimester: Secondary | ICD-10-CM

## 2018-10-04 DIAGNOSIS — Z3482 Encounter for supervision of other normal pregnancy, second trimester: Secondary | ICD-10-CM

## 2018-10-04 DIAGNOSIS — O99212 Obesity complicating pregnancy, second trimester: Secondary | ICD-10-CM

## 2018-10-04 DIAGNOSIS — O98211 Gonorrhea complicating pregnancy, first trimester: Secondary | ICD-10-CM

## 2018-10-04 NOTE — Progress Notes (Signed)
   PRENATAL VISIT NOTE  Subjective:  Courtney Lyons is a 27 y.o. [id number] at 3249w2d being seen today for ongoing prenatal care.  She is currently monitored for the following issues for this low-risk pregnancy and has Supervision of other normal pregnancy, antepartum; Obesity during pregnancy, antepartum; and Gonorrhea of mother during pregnancy, first trimester on their problem list.  Patient reports no complaints.  Contractions: Not present. Vag. Bleeding: None.  Movement: Present. Denies leaking of fluid.   The following portions of the patient's history were reviewed and updated as appropriate: allergies, current medications, past family history, past medical history, past social history, past surgical history and problem list. Problem list updated.  Objective:  Vitals:   10/04/18 1121  BP: 128/74  Pulse: 73  Weight: 221 lb (100.2 kg)    Fetal Status: Fetal Heart Rate (bpm): 145 Fundal Height: 22 cm Movement: Present     General:  Alert, oriented and cooperative. Patient is in no acute distress.  Skin: Skin is warm and dry. No rash noted.   Cardiovascular: Normal heart rate noted  Respiratory: Normal respiratory effort, no problems with respiration noted  Abdomen: Soft, gravid, appropriate for gestational age.  Pain/Pressure: Absent     Pelvic: Cervical exam deferred        Extremities: Normal range of motion.  Edema: None  Mental Status: Normal mood and affect. Normal behavior. Normal judgment and thought content.   Assessment and Plan:  Pregnancy: [id number] at 4149w2d  1. Supervision of other normal pregnancy, antepartum - Patient doing well, no complaints  - Anticipatory guidance on upcoming appointments  - Results reviewed from NOB appointment  - Anatomy US completed, normal   2. Obesity during pregnancy, antepartum - Educated and discussed patient to continue taking aspirin as prescribed, patient verbalized understanding  - Recommendations of weight gain during pregnancy  discussed   3. Gonorrhea of mother during pregnancy, first trimester - TOC on 11/21 negative   Preterm labor symptoms and general obstetric precautions including but not limited to vaginal bleeding, contractions, leaking of fluid and fetal movement were reviewed in detail with the patient. Please refer to After Visit Summary for other counseling recommendations.  Return in about 4 weeks (around 11/01/2018) for ROB.  Future Appointments  Date Time Provider Department Center  11/01/2018 11:00 AM Sharyon Cableogers, Shauna Bodkins C, CNM [facility] None    Sharyon CableVeronica C Sante [place]iedermann, [place]

## 2018-10-04 NOTE — Patient Instructions (Signed)

## 2018-10-17 NOTE — L&D Delivery Note (Signed)
   Delivery Note I was called to room d/t pt delivering. Upon my arrival, the baby was already delivered under RN supervision. At 6:55 AM a viable female was delivered via Vaginal, Spontaneous (Presentation: OA ).  APGAR: 9, 9; weight pending. After 1 minute, the cord was clamped and cut. 40 units of pitocin diluted in 1000cc LR was infused rapidly IV.  The placenta separated spontaneously and delivered via CCT and maternal pushing effort.  It was inspected and appears to be intact with a 3 VC.   Anesthesia:none   Episiotomy: None Lacerations: None Suture Repair:  Est. Blood Loss (mL): 50  Mom to postpartum.  Baby to Couplet care / Skin to Skin.  Scarlette Calico Cresenzo-Dishmon 02/19/2019, 7:14 AM

## 2018-11-01 ENCOUNTER — Ambulatory Visit (INDEPENDENT_AMBULATORY_CARE_PROVIDER_SITE_OTHER): Payer: Medicaid Other | Admitting: Certified Nurse Midwife

## 2018-11-01 ENCOUNTER — Encounter: Payer: Self-pay | Admitting: Certified Nurse Midwife

## 2018-11-01 VITALS — BP 124/78 | HR 85 | Wt 223.0 lb

## 2018-11-01 DIAGNOSIS — Z3482 Encounter for supervision of other normal pregnancy, second trimester: Secondary | ICD-10-CM

## 2018-11-01 DIAGNOSIS — O99019 Anemia complicating pregnancy, unspecified trimester: Secondary | ICD-10-CM | POA: Insufficient documentation

## 2018-11-01 DIAGNOSIS — D509 Iron deficiency anemia, unspecified: Secondary | ICD-10-CM

## 2018-11-01 DIAGNOSIS — O99210 Obesity complicating pregnancy, unspecified trimester: Secondary | ICD-10-CM

## 2018-11-01 DIAGNOSIS — O99212 Obesity complicating pregnancy, second trimester: Secondary | ICD-10-CM

## 2018-11-01 DIAGNOSIS — O26899 Other specified pregnancy related conditions, unspecified trimester: Secondary | ICD-10-CM

## 2018-11-01 DIAGNOSIS — Z3480 Encounter for supervision of other normal pregnancy, unspecified trimester: Secondary | ICD-10-CM

## 2018-11-01 DIAGNOSIS — O26892 Other specified pregnancy related conditions, second trimester: Secondary | ICD-10-CM

## 2018-11-01 DIAGNOSIS — O99012 Anemia complicating pregnancy, second trimester: Secondary | ICD-10-CM

## 2018-11-01 DIAGNOSIS — R102 Pelvic and perineal pain: Secondary | ICD-10-CM

## 2018-11-01 DIAGNOSIS — Z3A23 23 weeks gestation of pregnancy: Secondary | ICD-10-CM

## 2018-11-01 HISTORY — DX: Anemia complicating pregnancy, unspecified trimester: D50.9

## 2018-11-01 MED ORDER — COMFORT FIT MATERNITY SUPP LG MISC: 1.0000 [IU] | Freq: Every day | 0 refills | Status: DC

## 2018-11-01 NOTE — Progress Notes (Signed)
   PRENATAL VISIT NOTE  Subjective:  Courtney Lyons is a 28 y.o. [id number] at [redacted]w[redacted]d being seen today for ongoing prenatal care.  She is currently monitored for the following issues for this low-risk pregnancy and has Supervision of other normal pregnancy, antepartum; Obesity during pregnancy, antepartum; Gonorrhea of mother during pregnancy, first trimester; and Iron deficiency anemia of pregnancy on their problem list.  Patient reports pelvic pain.  Contractions: Not present. Vag. Bleeding: None.  Movement: Present. Denies leaking of fluid.   The following portions of the patient's history were reviewed and updated as appropriate: allergies, current medications, past family history, past medical history, past social history, past surgical history and problem list. Problem list updated.  Objective:   Vitals:   11/01/18 1126  BP: 124/78  Pulse: 85  Weight: 223 lb (101.2 kg)    Fetal Status: Fetal Heart Rate (bpm): 158 Fundal Height: 24 cm Movement: Present     General:  Alert, oriented and cooperative. Patient is in no acute distress.  Skin: Skin is warm and dry. No rash noted.   Cardiovascular: Normal heart rate noted  Respiratory: Normal respiratory effort, no problems with respiration noted  Abdomen: Soft, gravid, appropriate for gestational age.  Pain/Pressure: Present     Pelvic: Cervical exam deferred        Extremities: Normal range of motion.  Edema: None  Mental Status: Normal mood and affect. Normal behavior. Normal judgment and thought content.   Assessment and Plan:  Pregnancy: [id number] at [redacted]w[redacted]d  1. Supervision of other normal pregnancy, antepartum - Patient doing well, reports occasional pelvic pain with movement and changing positions  - Routine prenatal care  - Anticipatory guidance on upcoming appointments with GTT screening at next appointment, discussed fasting prior to appointment   2. Obesity during pregnancy, antepartum - TWG 18lbs  - Patient reports  starting to workout slowly and eating more fruits and vegetables daily   3. Iron deficiency anemia of pregnancy - Continue iron supplements will recheck CBC at next prenatal visit   4. Pain of round ligament during pregnancy - Elastic Bandages & Supports (COMFORT FIT MATERNITY SUPP LG) MISC; 1 Units by Does not apply route daily.  Dispense: 1 each; Refill: 0  Preterm labor symptoms and general obstetric precautions including but not limited to vaginal bleeding, contractions, leaking of fluid and fetal movement were reviewed in detail with the patient. Please refer to After Visit Summary for other counseling recommendations.  Return in about 4 weeks (around 11/29/2018) for ROB/2hrGTT/labs.  Future Appointments  Date Time Provider Department Center  11/29/2018  8:00 AM [facility] None  11/29/2018  8:15 AM Sharyon Cable, CNM [facility] None    Sharyon Cable, CNM

## 2018-11-01 NOTE — Patient Instructions (Signed)
Glucose Tolerance Test During Pregnancy Why am I having this test? The glucose tolerance test (GTT) is done to check how your body processes sugar (glucose). This is one of several tests used to diagnose diabetes that develops during pregnancy (gestational diabetes mellitus). Gestational diabetes is a temporary form of diabetes that some women develop during pregnancy. It usually occurs during the second trimester of pregnancy and goes away after delivery. Testing (screening) for gestational diabetes usually occurs between 24 and 28 weeks of pregnancy. You may have the GTT test after having a 1-hour glucose screening test if the results from that test indicate that you may have gestational diabetes. You may also have this test if:  You have a history of gestational diabetes.  You have a history of giving birth to very large babies or have experienced repeated fetal loss (stillbirth).  You have signs and symptoms of diabetes, such as: ? Changes in your vision. ? Tingling or numbness in your hands or feet. ? Changes in hunger, thirst, and urination that are not otherwise explained by your pregnancy. What is being tested? This test measures the amount of glucose in your blood at different times during a period of 3 hours. This indicates how well your body is able to process glucose. What kind of sample is taken?  Blood samples are required for this test. They are usually collected by inserting a needle into a blood vessel. How do I prepare for this test?  For 3 days before your test, eat normally. Have plenty of carbohydrate-rich foods.  Follow instructions from your health care provider about: ? Eating or drinking restrictions on the day of the test. You may be asked to not eat or drink anything other than water (fast) starting 8-10 hours before the test. ? Changing or stopping your regular medicines. Some medicines may interfere with this test. Tell a health care provider about:  All  medicines you are taking, including vitamins, herbs, eye drops, creams, and over-the-counter medicines.  Any blood disorders you have.  Any surgeries you have had.  Any medical conditions you have. What happens during the test? First, your blood glucose will be measured. This is referred to as your fasting blood glucose, since you fasted before the test. Then, you will drink a glucose solution that contains a certain amount of glucose. Your blood glucose will be measured again 1, 2, and 3 hours after drinking the solution. This test takes about 3 hours to complete. You will need to stay at the testing location during this time. During the testing period:  Do not eat or drink anything other than the glucose solution.  Do not exercise.  Do not use any products that contain nicotine or tobacco, such as cigarettes and e-cigarettes. If you need help stopping, ask your health care provider. The testing procedure may vary among health care providers and hospitals. How are the results reported? Your results will be reported as milligrams of glucose per deciliter of blood (mg/dL) or millimoles per liter (mmol/L). Your health care provider will compare your results to normal ranges that were established after testing a large group of people (reference ranges). Reference ranges may vary among labs and hospitals. For this test, common reference ranges are:  Fasting: less than 95-105 mg/dL (5.3-5.8 mmol/L).  1 hour after drinking glucose: less than 180-190 mg/dL (10.0-10.5 mmol/L).  2 hours after drinking glucose: less than 155-165 mg/dL (8.6-9.2 mmol/L).  3 hours after drinking glucose: 140-145 mg/dL (7.8-8.1 mmol/L). What do the   results mean? Results within reference ranges are considered normal, meaning that your glucose levels are well-controlled. If two or more of your blood glucose levels are high, you may be diagnosed with gestational diabetes. If only one level is high, your health care  provider may suggest repeat testing or other tests to confirm a diagnosis. Talk with your health care provider about what your results mean. Questions to ask your health care provider Ask your health care provider, or the department that is doing the test:  When will my results be ready?  How will I get my results?  What are my treatment options?  What other tests do I need?  What are my next steps? Summary  The glucose tolerance test (GTT) is one of several tests used to diagnose diabetes that develops during pregnancy (gestational diabetes mellitus). Gestational diabetes is a temporary form of diabetes that some women develop during pregnancy.  You may have the GTT test after having a 1-hour glucose screening test if the results from that test indicate that you may have gestational diabetes. You may also have this test if you have any symptoms or risk factors for gestational diabetes.  Talk with your health care provider about what your results mean. This information is not intended to replace advice given to you by your health care provider. Make sure you discuss any questions you have with your health care provider. Document Released: 04/03/2012 Document Revised: 05/15/2017 Document Reviewed: 05/15/2017 Elsevier Interactive Patient Education  2019 Elsevier Inc.  

## 2018-11-29 ENCOUNTER — Encounter: Payer: Self-pay | Admitting: Certified Nurse Midwife

## 2018-11-29 ENCOUNTER — Ambulatory Visit (INDEPENDENT_AMBULATORY_CARE_PROVIDER_SITE_OTHER): Payer: Medicaid Other | Admitting: Certified Nurse Midwife

## 2018-11-29 ENCOUNTER — Other Ambulatory Visit: Payer: Medicaid Other | Admitting: Obstetrics

## 2018-11-29 VITALS — BP 116/78 | HR 81 | Wt 230.5 lb

## 2018-11-29 DIAGNOSIS — O99210 Obesity complicating pregnancy, unspecified trimester: Secondary | ICD-10-CM

## 2018-11-29 DIAGNOSIS — O99012 Anemia complicating pregnancy, second trimester: Secondary | ICD-10-CM

## 2018-11-29 DIAGNOSIS — Z23 Encounter for immunization: Secondary | ICD-10-CM | POA: Diagnosis not present

## 2018-11-29 DIAGNOSIS — D509 Iron deficiency anemia, unspecified: Secondary | ICD-10-CM

## 2018-11-29 DIAGNOSIS — Z3480 Encounter for supervision of other normal pregnancy, unspecified trimester: Secondary | ICD-10-CM

## 2018-11-29 DIAGNOSIS — Z3482 Encounter for supervision of other normal pregnancy, second trimester: Secondary | ICD-10-CM

## 2018-11-29 DIAGNOSIS — O99019 Anemia complicating pregnancy, unspecified trimester: Secondary | ICD-10-CM

## 2018-11-29 DIAGNOSIS — O99212 Obesity complicating pregnancy, second trimester: Secondary | ICD-10-CM

## 2018-11-29 NOTE — Progress Notes (Signed)
   PRENATAL VISIT NOTE  Subjective:  Courtney Lyons is a 28 y.o. [id number] at [redacted]w[redacted]d being seen today for ongoing prenatal care.  She is currently monitored for the following issues for this low-risk pregnancy and has Supervision of other normal pregnancy, antepartum; Obesity during pregnancy, antepartum; Gonorrhea of mother during pregnancy, first trimester; and Iron deficiency anemia of pregnancy on their problem list.  Patient reports no complaints.  Contractions: Not present. Vag. Bleeding: None.  Movement: Present. Denies leaking of fluid.   The following portions of the patient's history were reviewed and updated as appropriate: allergies, current medications, past family history, past medical history, past social history, past surgical history and problem list. Problem list updated.  Objective:   Vitals:   11/29/18 0821  BP: 116/78  Pulse: 81  Weight: 230 lb 8 oz (104.6 kg)    Fetal Status: Fetal Heart Rate (bpm): 146 Fundal Height: 31 cm Movement: Present     General:  Alert, oriented and cooperative. Patient is in no acute distress.  Skin: Skin is warm and dry. No rash noted.   Cardiovascular: Normal heart rate noted  Respiratory: Normal respiratory effort, no problems with respiration noted  Abdomen: Soft, gravid, appropriate for gestational age.  Pain/Pressure: Absent     Pelvic: Cervical exam deferred        Extremities: Normal range of motion.  Edema: None  Mental Status: Normal mood and affect. Normal behavior. Normal judgment and thought content.   Assessment and Plan:  Pregnancy: [id number] at [redacted]w[redacted]d  1. Supervision of other normal pregnancy, antepartum - Patient doing well, no complaints - Anticipatory guidance on upcoming appointments along with guidance on possible results of GTT  - Routine prenatal care  - Glucose Tolerance, 2 Hours w/1 Hour - CBC - RPR - HIV antibody (with reflex) - Tdap vaccine greater than or equal to 7yo IM  2. Iron deficiency anemia of  pregnancy - Continue iron supplementation as prescribed  - Recheck iron level with labs today   3. Obesity during pregnancy, antepartum - Discussed recommendations of weight gain during pregnancy  - Continue BASA   Preterm labor symptoms and general obstetric precautions including but not limited to vaginal bleeding, contractions, leaking of fluid and fetal movement were reviewed in detail with the patient. Please refer to After Visit Summary for other counseling recommendations.  Return in about 2 weeks (around 12/13/2018) for ROB.  Future Appointments  Date Time Provider Department Center  12/13/2018  9:45 AM Sharyon Cable, CNM [facility] None    Sharyon Cable, CNM

## 2018-11-29 NOTE — Patient Instructions (Signed)
Tdap Vaccine (Tetanus, Diphtheria and Pertussis): What You Need to Know  1. Why get vaccinated?  Tetanus, diphtheria and pertussis are very serious diseases. Tdap vaccine can protect us from these diseases. And, Tdap vaccine given to pregnant women can protect newborn babies against pertussis..  TETANUS (Lockjaw) is rare in the [place] today. It causes painful muscle tightening and stiffness, usually all over the body.  · It can lead to tightening of muscles in the head and neck so you can't open your mouth, swallow, or sometimes even breathe. Tetanus kills about 1 out of 10 people who are infected even after receiving the best medical care.  DIPHTHERIA is also rare in the [place] today. It can cause a thick coating to form in the back of the throat.  · It can lead to breathing problems, heart failure, paralysis, and death.  PERTUSSIS (Whooping Cough) causes severe coughing spells, which can cause difficulty breathing, vomiting and disturbed sleep.  · It can also lead to weight loss, incontinence, and rib fractures. Up to 2 in 100 adolescents and 5 in 100 adults with pertussis are hospitalized or have complications, which could include pneumonia or death.  These diseases are caused by bacteria. Diphtheria and pertussis are spread from person to person through secretions from coughing or sneezing. Tetanus enters the body through cuts, scratches, or wounds.  Before vaccines, as many as 200,000 cases of diphtheria, 200,000 cases of pertussis, and hundreds of cases of tetanus, were reported in the [place] each year. Since vaccination began, reports of cases for tetanus and diphtheria have dropped by about 99% and for pertussis by about 80%.  2. Tdap vaccine  Tdap vaccine can protect adolescents and adults from tetanus, diphtheria, and pertussis. One dose of Tdap is routinely given at age 11 or 12. People who did not get Tdap at that age should get it as soon as possible.  Tdap is especially important  for healthcare professionals and anyone having close contact with a baby younger than 12 months.  Pregnant women should get a dose of Tdap during every pregnancy, to protect the newborn from pertussis. Infants are most at risk for severe, life-threatening complications from pertussis.  Another vaccine, called Td, protects against tetanus and diphtheria, but not pertussis. A Td booster should be given every 10 years. Tdap may be given as one of these boosters if you have never gotten Tdap before. Tdap may also be given after a severe cut or burn to prevent tetanus infection.  Your doctor or the person giving you the vaccine can give you more information.  Tdap may safely be given at the same time as other vaccines.  3. Some people should not get this vaccine  · A person who has ever had a life-threatening allergic reaction after a previous dose of any diphtheria, tetanus or pertussis containing vaccine, OR has a severe allergy to any part of this vaccine, should not get Tdap vaccine. Tell the person giving the vaccine about any severe allergies.  · Anyone who had coma or long repeated seizures within 7 days after a childhood dose of DTP or DTaP, or a previous dose of Tdap, should not get Tdap, unless a cause other than the vaccine was found. They can still get Td.  · Talk to your doctor if you:  ? have seizures or another nervous system problem,  ? had severe pain or swelling after any vaccine containing diphtheria, tetanus or pertussis,  ? ever had a condition   called Guillain-Barré Syndrome (GBS),  ? aren't feeling well on the day the shot is scheduled.  4. Risks  With any medicine, including vaccines, there is a chance of side effects. These are usually mild and go away on their own. Serious reactions are also possible but are rare.  Most people who get Tdap vaccine do not have any problems with it.  Mild problems following Tdap  (Did not interfere with activities)  · Pain where the shot was given (about 3 in 4  adolescents or 2 in 3 adults)  · Redness or swelling where the shot was given (about 1 person in 5)  · Mild fever of at least 100.4°F (up to about 1 in 25 adolescents or 1 in 100 adults)  · Headache (about 3 or 4 people in 10)  · Tiredness (about 1 person in 3 or 4)  · Nausea, vomiting, diarrhea, stomach ache (up to 1 in 4 adolescents or 1 in 10 adults)  · Chills, sore joints (about 1 person in 10)  · Body aches (about 1 person in 3 or 4)  · Rash, swollen glands (uncommon)  Moderate problems following Tdap  (Interfered with activities, but did not require medical attention)  · Pain where the shot was given (up to 1 in 5 or 6)  · Redness or swelling where the shot was given (up to about 1 in 16 adolescents or 1 in 12 adults)  · Fever over 102°F (about 1 in 100 adolescents or 1 in 250 adults)  · Headache (about 1 in 7 adolescents or 1 in 10 adults)  · Nausea, vomiting, diarrhea, stomach ache (up to 1 or 3 people in 100)  · Swelling of the entire arm where the shot was given (up to about 1 in 500).  Severe problems following Tdap  (Unable to perform usual activities; required medical attention)  · Swelling, severe pain, bleeding and redness in the arm where the shot was given (rare).  Problems that could happen after any vaccine:  · People sometimes faint after a medical procedure, including vaccination. Sitting or lying down for about 15 minutes can help prevent fainting, and injuries caused by a fall. Tell your doctor if you feel dizzy, or have vision changes or ringing in the ears.  · Some people get severe pain in the shoulder and have difficulty moving the arm where a shot was given. This happens very rarely.  · Any medication can cause a severe allergic reaction. Such reactions from a vaccine are very rare, estimated at fewer than 1 in a million doses, and would happen within a few minutes to a few hours after the vaccination.  As with any medicine, there is a very remote chance of a vaccine causing a serious  injury or death.  The safety of vaccines is always being monitored. For more information, visit: www.cdc.gov/vaccinesafety/  5. What if there is a serious problem?  What should I look for?  · Look for anything that concerns you, such as signs of a severe allergic reaction, very high fever, or unusual behavior.  Signs of a severe allergic reaction can include hives, swelling of the face and throat, difficulty breathing, a fast heartbeat, dizziness, and weakness. These would usually start a few minutes to a few hours after the vaccination.  What should I do?  · If you think it is a severe allergic reaction or other emergency that can't wait, call 9-1-1 or get the person to the nearest hospital. Otherwise,   call your doctor.  · Afterward, the reaction should be reported to the Vaccine Adverse Event Reporting System (VAERS). Your doctor might file this report, or you can do it yourself through the VAERS web site at www.vaers.hhs.gov, or by calling 1-800-822-7967.  VAERS does not give medical advice.  6. The National Vaccine Injury Compensation Program  The National Vaccine Injury Compensation Program (VICP) is a federal program that was created to compensate people who may have been injured by certain vaccines.  Persons who believe they may have been injured by a vaccine can learn about the program and about filing a claim by calling 1-800-338-2382 or visiting the VICP website at www.hrsa.gov/vaccinecompensation. There is a time limit to file a claim for compensation.  7. How can I learn more?  · Ask your doctor. He or she can give you the vaccine package insert or suggest other sources of information.  · Call your local or state health department.  · Contact the Centers for Disease Control and Prevention (CDC):  ? Call 1-800-232-4636 (1-800-CDC-INFO) or  ? Visit CDC's website at www.cdc.gov/vaccines  Vaccine Information Statement Tdap Vaccine (12/10/2013)  This information is not intended to replace advice given to you  by your health care provider. Make sure you discuss any questions you have with your health care provider.  Document Released: 04/03/2012 Document Revised: 05/21/2018 Document Reviewed: 05/21/2018  Elsevier Interactive Patient Education © 2019 [employer] Inc.

## 2018-11-30 LAB — GLUCOSE TOLERANCE, 2 HOURS W/ 1HR
Glucose, 1 hour: 106 mg/dL (ref 65–179)
Glucose, 2 hour: 90 mg/dL (ref 65–152)
Glucose, Fasting: 83 mg/dL (ref 65–91)

## 2018-11-30 LAB — RPR: RPR Ser Ql: NONREACTIVE

## 2018-11-30 LAB — HIV ANTIBODY (ROUTINE TESTING W REFLEX): HIV Screen 4th Generation wRfx: NONREACTIVE

## 2018-11-30 LAB — CBC
Hematocrit: 29.1 % — ABNORMAL LOW (ref 34.0–46.6)
Hemoglobin: 9.7 g/dL — ABNORMAL LOW (ref 11.1–15.9)
MCH: 27.2 pg (ref 26.6–33.0)
MCHC: 33.3 g/dL (ref 31.5–35.7)
MCV: 82 fL (ref 79–97)
Platelets: 243 10*3/uL (ref 150–450)
RBC: 3.57 x10E6/uL — ABNORMAL LOW (ref 3.77–5.28)
RDW: 14.7 % (ref 11.7–15.4)
WBC: 5.8 10*3/uL (ref 3.4–10.8)

## 2018-12-07 ENCOUNTER — Other Ambulatory Visit: Payer: Self-pay

## 2018-12-07 ENCOUNTER — Emergency Department (HOSPITAL_COMMUNITY)
Admission: EM | Admit: 2018-12-07 | Discharge: 2018-12-07 | Disposition: A | Discharge status: Home / Self Care | Payer: Medicaid Other | Attending: Emergency Medicine | Admitting: Emergency Medicine

## 2018-12-07 ENCOUNTER — Encounter (HOSPITAL_COMMUNITY): Payer: Self-pay | Admitting: Emergency Medicine

## 2018-12-07 DIAGNOSIS — J029 Acute pharyngitis, unspecified: Secondary | ICD-10-CM | POA: Diagnosis not present

## 2018-12-07 DIAGNOSIS — Z79899 Other long term (current) drug therapy: Secondary | ICD-10-CM | POA: Insufficient documentation

## 2018-12-07 DIAGNOSIS — R070 Pain in throat: Secondary | ICD-10-CM | POA: Diagnosis present

## 2018-12-07 LAB — GROUP A STREP BY PCR: Group A Strep by PCR: NOT DETECTED

## 2018-12-07 NOTE — ED Provider Notes (Signed)
[facility] EMERGENCY DEPARTMENT Provider Note   CSN: [id number] Arrival date & time: 12/07/18  1216    History   Chief Complaint Chief Complaint  Patient presents with  . Sore Throat    HPI Courtney Lyons is a 28 y.o. female.     The history is provided by the patient. No language interpreter was used.  Sore Throat  Pertinent negatives include no chest pain, no abdominal pain and no shortness of breath.     28 year old female presenting for evaluation of sore throat.  Patient report her daughter recently diagnosed with sore throat several days prior.  For the past 2 days she also endorsed having sore throat radiates to her ears.  Symptom is mild to moderate in severity increased with swallowing, soreness.  No associated fever or chills, no chest pain shortness of breath, runny nose, sneezing or headache.  She does have occasional productive cough but no shortness of breath.  No body aches.  She is 7 months pregnant.  No complaint of abdominal pain vaginal bleeding or abdominal discomfort.  No specific treatment tried.  Reports history of strep infection in the past.    Past Medical History:  Diagnosis Date  . Anemia   . Pregnancy induced hypertension     Patient Active Problem List   Diagnosis Date Noted  . Iron deficiency anemia of pregnancy 11/01/2018  . Supervision of other normal pregnancy, antepartum 09/06/2018  . Obesity during pregnancy, antepartum 09/06/2018  . Gonorrhea of mother during pregnancy, first trimester 09/06/2018    History reviewed. No pertinent surgical history.   OB History    Gravida  17   Para  2   Term  2   Preterm      AB  14   Living  2     SAB      TAB  14   Ectopic      Multiple      Live Births  2            Home Medications    Prior to Admission medications   Medication Sig Start Date End Date Taking? Authorizing Provider  Elastic Bandages & Supports (COMFORT FIT MATERNITY SUPP LG) MISC 1  Units by Does not apply route daily. 11/01/18   Sharyon Cableogers, Veronica C, CNM  ferrous sulfate (FERROUSUL) 325 (65 FE) MG tablet Take 1 tablet (325 mg total) by mouth daily with breakfast. 09/12/18   Sharyon Cableogers, Veronica C, CNM  fluticasone (FLONASE) 50 MCG/ACT nasal spray Place 2 sprays into both nostrils daily. 04/30/18   Cathie HoopsYu, Amy V, PA-C  ipratropium (ATROVENT) 0.06 % nasal spray Place 2 sprays into both nostrils 4 (four) times daily. 04/30/18   Cathie HoopsYu, Amy V, PA-C  Prenat-FeFum-FePo-FA-DHA w/o A (PROVIDA DHA) 16-16-1.25-110 MG CAPS Take 1 tablet by mouth daily. 09/06/18   Sharyon Cableogers, Veronica C, CNM    Family History Family History  Problem Relation Age of Onset  . Hypertension Mother   . Stroke Mother   . Diabetes Father   . Birth defects Brother   . Asthma Daughter   . Cancer Paternal Aunt   . Early death Paternal Aunt   . Vision loss Paternal Grandfather     Social History Social History   Tobacco Use  . Smoking status: Never Smoker  . Smokeless tobacco: Never Used  Substance Use Topics  . Alcohol use: Yes  . Drug use: Not Currently    Types: Marijuana    Comment:  stopped Aug/2019     Allergies   Patient has no known allergies.   Review of Systems Review of Systems  Constitutional: Negative for fever.  HENT: Positive for sore throat. Negative for dental problem, trouble swallowing and voice change.   Respiratory: Negative for shortness of breath.   Cardiovascular: Negative for chest pain.  Gastrointestinal: Negative for abdominal pain.     Physical Exam Updated Vital Signs BP 134/69   Pulse 90   Temp 98.3 F (36.8 C) (Oral)   Resp 16   Ht 5\' 4"  (1.626 m)   Wt 104.3 kg   LMP 05/22/2018 (Exact Date)   SpO2 100%   BMI 39.48 kg/m   Physical Exam Vitals signs and nursing note reviewed.  Constitutional:      General: She is not in acute distress.    Appearance: She is well-developed. She is obese.  HENT:     Head: Atraumatic.     Right Ear: Tympanic membrane normal.      Left Ear: Tympanic membrane normal.     Nose: No congestion.     Mouth/Throat:     Mouth: Mucous membranes are moist.     Pharynx: Oropharynx is clear. Uvula midline. No uvula swelling.     Tonsils: No tonsillar exudate or tonsillar abscesses. Swelling: 1+ on the right. 1+ on the left.  Eyes:     Conjunctiva/sclera: Conjunctivae normal.  Neck:     Musculoskeletal: Neck supple.  Cardiovascular:     Rate and Rhythm: Normal rate and regular rhythm.  Pulmonary:     Breath sounds: Normal breath sounds. No wheezing, rhonchi or rales.  Abdominal:     Palpations: Abdomen is soft.     Tenderness: There is no abdominal tenderness.     Comments: Gravid abdomen, nontender to palpation.  Skin:    Findings: No rash.  Neurological:     Mental Status: She is alert.      ED Treatments / Results  Labs (all labs ordered are listed, but only abnormal results are displayed) Labs Reviewed  GROUP A STREP BY PCR    EKG None  Radiology No results found.  Procedures Procedures (including critical care time)  Medications Ordered in ED Medications - No data to display   Initial Impression / Assessment and Plan / ED Course  I have reviewed the triage vital signs and the nursing notes.  Pertinent labs & imaging results that were available during my care of the patient were reviewed by me and considered in my medical decision making (see chart for details).        BP 134/69   Pulse 90   Temp 98.3 F (36.8 C) (Oral)   Resp 16   Ht 5\' 4"  (1.626 m)   Wt 104.3 kg   LMP 05/22/2018 (Exact Date)   SpO2 100%   BMI 39.48 kg/m    Final Clinical Impressions(s) / ED Diagnoses   Final diagnoses:  Sore throat    ED Discharge Orders    None     1:59 PM Patient here with sore throat.  Daughter was recently diagnosed with strep infection.  Throat exam unremarkable, no evidence concerning for deep tissue infection.  She is well-appearing.  She is 7 months pregnant, will assess fetal  heart tone but otherwise no symptoms to suggest OB complication.  3:09 PM 14:13:30 Fetal Heart Rate MT  Fetal Heart Rate A  Mode: Doppler  Baseline Rate (A): 145 bpm  Multiple birth?: No  Strep test is negative, fetal heart tone is 145, at this time, patient stable for discharge.  Return precaution discussed.   Fayrene Helper, PA-C 12/07/18 1511    Gwyneth Sprout, MD 12/12/18 2101

## 2018-12-07 NOTE — ED Triage Notes (Signed)
Onset 2 days ago developed a sore throat and continued today. Pain currently 2/10 sore.

## 2018-12-13 ENCOUNTER — Encounter: Payer: Self-pay | Admitting: Certified Nurse Midwife

## 2018-12-13 ENCOUNTER — Ambulatory Visit (INDEPENDENT_AMBULATORY_CARE_PROVIDER_SITE_OTHER): Payer: Medicaid Other | Admitting: Certified Nurse Midwife

## 2018-12-13 VITALS — BP 124/82 | HR 73 | Wt 228.4 lb

## 2018-12-13 DIAGNOSIS — O99210 Obesity complicating pregnancy, unspecified trimester: Secondary | ICD-10-CM

## 2018-12-13 DIAGNOSIS — O99019 Anemia complicating pregnancy, unspecified trimester: Principal | ICD-10-CM

## 2018-12-13 DIAGNOSIS — D509 Iron deficiency anemia, unspecified: Secondary | ICD-10-CM

## 2018-12-13 DIAGNOSIS — Z3A29 29 weeks gestation of pregnancy: Secondary | ICD-10-CM

## 2018-12-13 DIAGNOSIS — O3660X0 Maternal care for excessive fetal growth, unspecified trimester, not applicable or unspecified: Secondary | ICD-10-CM | POA: Insufficient documentation

## 2018-12-13 DIAGNOSIS — Z3480 Encounter for supervision of other normal pregnancy, unspecified trimester: Secondary | ICD-10-CM

## 2018-12-13 DIAGNOSIS — O3663X0 Maternal care for excessive fetal growth, third trimester, not applicable or unspecified: Secondary | ICD-10-CM

## 2018-12-13 DIAGNOSIS — Z3482 Encounter for supervision of other normal pregnancy, second trimester: Secondary | ICD-10-CM

## 2018-12-13 DIAGNOSIS — O99212 Obesity complicating pregnancy, second trimester: Secondary | ICD-10-CM

## 2018-12-13 DIAGNOSIS — O99012 Anemia complicating pregnancy, second trimester: Secondary | ICD-10-CM

## 2018-12-13 HISTORY — DX: Maternal care for excessive fetal growth, unspecified trimester, not applicable or unspecified: O36.60X0

## 2018-12-13 NOTE — Patient Instructions (Signed)
Pregnancy and Anemia ° °Anemia is a condition in which the concentration of red blood cells, or hemoglobin, in the blood is below normal. Hemoglobin is a substance in red blood cells that carries oxygen to the tissues of the body. Anemia results when enough oxygen does not reach these tissues. °Anemia is common during pregnancy because the woman's body needs more blood volume and blood cells to provide nutrition to the fetus. The fetus needs iron and folic acid as it is developing. Your body may not produce enough red blood cells because of this. Also, during pregnancy, the liquid part of the blood (plasma) increases by about 30-50%, and the red blood cells increase by only 20%. This lowers the concentration of the red blood cells and creates a natural anemia-like situation. °What are the causes? °The most common cause of anemia during pregnancy is not having enough iron in the body to make red blood cells (iron deficiency anemia). Other causes may include: °· Folic acid deficiency. °· Vitamin B12 deficiency. °· Certain prescription or over-the-counter medicines. °· Certain medical conditions or infections that destroy red blood cells. °· A low platelet count and bleeding caused by antibodies that go through the placenta to the fetus from the mother’s blood. °What are the signs or symptoms? °Mild anemia may not be noticeable. If it becomes severe, symptoms may include: °· Feeling tired (fatigue). °· Shortness of breath, especially during activity. °· Weakness. °· Fainting. °· Pale looking skin. °· Headaches. °· A fast or irregular heartbeat (palpitations). °· Dizziness. °How is this diagnosed? °This condition may be diagnosed based on: °· Your medical history and a physical exam. °· Blood tests. °How is this treated? °Treatment for anemia during pregnancy depends on the cause of the anemia. Treatment can include: °· Dietary changes. °· Supplements of iron, vitamin B12, or folic acid. °· A blood transfusion. This may  be needed if anemia is severe. °· Hospitalization. This may be needed if there is a lot of blood loss or severe anemia. °Follow these instructions at home: °· Follow recommendations from your dietitian or health care provider about changing your diet. °· Increase your vitamin C intake. This will help the stomach absorb more iron. Some foods that are high in vitamin C include: °? Oranges. °? Peppers. °? Tomatoes. °? Mangoes. °· Eat a diet rich in iron. This would include foods such as: °? Liver. °? Beef. °? Eggs. °? Whole grains. °? Spinach. °? Dried fruit. °· Take iron and vitamins as told by your health care provider. °· Eat green leafy vegetables. These are a good source of folic acid. °· Keep all follow-up visits as told by your health care provider. This is important. °Contact a health care provider if: °· You have frequent or lasting headaches. °· You look pale. °· You bruise easily. °Get help right away if: °· You have extreme weakness, shortness of breath, or chest pain. °· You become dizzy or have trouble concentrating. °· You have heavy vaginal bleeding. °· You develop a rash. °· You have bloody or black, tarry stools. °· You faint. °· You vomit up blood. °· You vomit repeatedly. °· You have abdominal pain. °· You have a fever. °· You are dehydrated. °Summary °· Anemia is a condition in which the concentration of red blood cells or hemoglobin in the blood is below normal. °· Anemia is common during pregnancy because the woman's body needs more blood volume and blood cells to provide nutrition to the fetus. °· The most   common cause of anemia during pregnancy is not having enough iron in the body to make red blood cells (iron deficiency anemia). °· Mild anemia may not be noticeable. If it becomes severe, symptoms may include feeling tired and weak. °This information is not intended to replace advice given to you by your health care provider. Make sure you discuss any questions you have with your health care  provider. °Document Released: 09/30/2000 Document Revised: 11/08/2016 Document Reviewed: 11/08/2016 °Elsevier Interactive Patient Education © 2019 Elsevier Inc. ° °

## 2018-12-13 NOTE — Progress Notes (Signed)
Pt is here for ROB. [redacted]w[redacted]d.

## 2018-12-13 NOTE — Progress Notes (Signed)
   PRENATAL VISIT NOTE  Subjective:  Courtney Lyons is a 28 y.o. [id number] at [redacted]w[redacted]d being seen today for ongoing prenatal care.  She is currently monitored for the following issues for this low-risk pregnancy and has Supervision of other normal pregnancy, antepartum; Obesity during pregnancy, antepartum; Gonorrhea of mother during pregnancy, first trimester; Iron deficiency anemia of pregnancy; and Large for gestational age fetus affecting management of mother on their problem list.  Patient reports no complaints.  Contractions: Not present. Vag. Bleeding: None.  Movement: Present. Denies leaking of fluid.   The following portions of the patient's history were reviewed and updated as appropriate: allergies, current medications, past family history, past medical history, past social history, past surgical history and problem list. Problem list updated.  Objective:   Vitals:   12/13/18 0915  BP: 124/82  Pulse: 73  Weight: 228 lb 6.4 oz (103.6 kg)    Fetal Status: Fetal Heart Rate (bpm): 135 Fundal Height: 33 cm Movement: Present     General:  Alert, oriented and cooperative. Patient is in no acute distress.  Skin: Skin is warm and dry. No rash noted.   Cardiovascular: Normal heart rate noted  Respiratory: Normal respiratory effort, no problems with respiration noted  Abdomen: Soft, gravid, appropriate for gestational age.  Pain/Pressure: Present     Pelvic: Cervical exam deferred        Extremities: Normal range of motion.  Edema: None  Mental Status: Normal mood and affect. Normal behavior. Normal judgment and thought content.   Assessment and Plan:  Pregnancy: [id number] at [redacted]w[redacted]d  1. Supervision of other normal pregnancy, antepartum - Patient doing well, no complaints  - Anticipatory guidance on upcoming appointments - Lab and GTT results reviewed with patient, encouraged to continue taking iron supplement   2. Iron deficiency anemia of pregnancy - Patient reports she stopped  taking iron supplement last week, encouraged to restart medication can take every other day to prevent constipation and better absorption, patient verbalizes understanding   3. Obesity during pregnancy, antepartum - TWG 23 lbs this pregnancy   4. Excessive fetal growth affecting management of pregnancy in third trimester, single or unspecified fetus - Size greater than dates by FH, f/u US for fetal growth needed   Preterm labor symptoms and general obstetric precautions including but not limited to vaginal bleeding, contractions, leaking of fluid and fetal movement were reviewed in detail with the patient. Please refer to After Visit Summary for other counseling recommendations.  Return in about 2 weeks (around 12/27/2018) for ROB.  Future Appointments  Date Time Provider Department Center  12/26/2018  3:45 PM Sharyon Cable, CNM [facility] None    Sharyon Cable, CNM

## 2018-12-26 ENCOUNTER — Encounter: Payer: Medicaid Other | Admitting: Certified Nurse Midwife

## 2018-12-28 ENCOUNTER — Encounter: Payer: Self-pay | Admitting: Medical

## 2018-12-28 ENCOUNTER — Other Ambulatory Visit: Payer: Self-pay

## 2018-12-28 ENCOUNTER — Ambulatory Visit (INDEPENDENT_AMBULATORY_CARE_PROVIDER_SITE_OTHER): Payer: Medicaid Other | Admitting: Medical

## 2018-12-28 VITALS — BP 116/69 | HR 75 | Wt 232.3 lb

## 2018-12-28 DIAGNOSIS — D509 Iron deficiency anemia, unspecified: Secondary | ICD-10-CM

## 2018-12-28 DIAGNOSIS — Z3A31 31 weeks gestation of pregnancy: Secondary | ICD-10-CM

## 2018-12-28 DIAGNOSIS — O99013 Anemia complicating pregnancy, third trimester: Secondary | ICD-10-CM

## 2018-12-28 DIAGNOSIS — Z3480 Encounter for supervision of other normal pregnancy, unspecified trimester: Secondary | ICD-10-CM

## 2018-12-28 DIAGNOSIS — O99210 Obesity complicating pregnancy, unspecified trimester: Secondary | ICD-10-CM

## 2018-12-28 DIAGNOSIS — O3663X0 Maternal care for excessive fetal growth, third trimester, not applicable or unspecified: Secondary | ICD-10-CM

## 2018-12-28 DIAGNOSIS — O99213 Obesity complicating pregnancy, third trimester: Secondary | ICD-10-CM

## 2018-12-28 DIAGNOSIS — O99019 Anemia complicating pregnancy, unspecified trimester: Secondary | ICD-10-CM

## 2018-12-28 NOTE — Patient Instructions (Signed)
Fetal Movement Counts Patient Name: ________________________________________________ Patient Due Date: ____________________ What is a fetal movement count?  A fetal movement count is the number of times that you feel your baby move during a certain amount of time. This may also be called a fetal kick count. A fetal movement count is recommended for every pregnant woman. You may be asked to start counting fetal movements as early as week 28 of your pregnancy. Pay attention to when your baby is most active. You may notice your baby's sleep and wake cycles. You may also notice things that make your baby move more. You should do a fetal movement count:  When your baby is normally most active.  At the same time each day. A good time to count movements is while you are resting, after having something to eat and drink. How do I count fetal movements? 1. Find a quiet, comfortable area. Sit, or lie down on your side. 2. Write down the date, the start time and stop time, and the number of movements that you felt between those two times. Take this information with you to your health care visits. 3. For 2 hours, count kicks, flutters, swishes, rolls, and jabs. You should feel at least 10 movements during 2 hours. 4. You may stop counting after you have felt 10 movements. 5. If you do not feel 10 movements in 2 hours, have something to eat and drink. Then, keep resting and counting for 1 hour. If you feel at least 4 movements during that hour, you may stop counting. Contact a health care provider if:  You feel fewer than 4 movements in 2 hours.  Your baby is not moving like he or she usually does. Date: ____________ Start time: ____________ Stop time: ____________ Movements: ____________ Date: ____________ Start time: ____________ Stop time: ____________ Movements: ____________ Date: ____________ Start time: ____________ Stop time: ____________ Movements: ____________ Date: ____________ Start time:  ____________ Stop time: ____________ Movements: ____________ Date: ____________ Start time: ____________ Stop time: ____________ Movements: ____________ Date: ____________ Start time: ____________ Stop time: ____________ Movements: ____________ Date: ____________ Start time: ____________ Stop time: ____________ Movements: ____________ Date: ____________ Start time: ____________ Stop time: ____________ Movements: ____________ Date: ____________ Start time: ____________ Stop time: ____________ Movements: ____________ This information is not intended to replace advice given to you by your health care provider. Make sure you discuss any questions you have with your health care provider. Document Released: 11/02/2006 Document Revised: 06/01/2016 Document Reviewed: 11/12/2015 Elsevier Interactive Patient Education  2019 Elsevier Inc[employer] Braxton Hicks Contractions Contractions of the uterus can occur throughout pregnancy, but they are not always a sign that you are in labor. You may have practice contractions called Braxton Hicks contractions. These false labor contractions are sometimes confused with true labor. What are Braxton Hicks contractions? Braxton Hicks contractions are tightening movements that occur in the muscles of the uterus before labor. Unlike true labor contractions, these contractions do not result in opening (dilation) and thinning of the cervix. Toward the end of pregnancy (32-34 weeks), Braxton Hicks contractions can happen more often and may become stronger. These contractions are sometimes difficult to tell apart from true labor because they can be very uncomfortable. You should not feel embarrassed if you go to the hospital with false labor. Sometimes, the only way to tell if you are in true labor is for your health care provider to look for changes in the cervix. The health care provider will do a physical exam and may monitor your contractions. If   you are not in true labor, the exam  should show that your cervix is not dilating and your water has not broken. If there are no other health problems associated with your pregnancy, it is completely safe for you to be sent home with false labor. You may continue to have Braxton Hicks contractions until you go into true labor. How to tell the difference between true labor and false labor True labor  Contractions last 30-70 seconds.  Contractions become very regular.  Discomfort is usually felt in the top of the uterus, and it spreads to the lower abdomen and low back.  Contractions do not go away with walking.  Contractions usually become more intense and increase in frequency.  The cervix dilates and gets thinner. False labor  Contractions are usually shorter and not as strong as true labor contractions.  Contractions are usually irregular.  Contractions are often felt in the front of the lower abdomen and in the groin.  Contractions may go away when you walk around or change positions while lying down.  Contractions get weaker and are shorter-lasting as time goes on.  The cervix usually does not dilate or become thin. Follow these instructions at home:   Take over-the-counter and prescription medicines only as told by your health care provider.  Keep up with your usual exercises and follow other instructions from your health care provider.  Eat and drink lightly if you think you are going into labor.  If Braxton Hicks contractions are making you uncomfortable: ? Change your position from lying down or resting to walking, or change from walking to resting. ? Sit and rest in a tub of warm water. ? Drink enough fluid to keep your urine pale yellow. Dehydration may cause these contractions. ? Do slow and deep breathing several times an hour.  Keep all follow-up prenatal visits as told by your health care provider. This is important. Contact a health care provider if:  You have a fever.  You have continuous  pain in your abdomen. Get help right away if:  Your contractions become stronger, more regular, and closer together.  You have fluid leaking or gushing from your vagina.  You pass blood-tinged mucus (bloody show).  You have bleeding from your vagina.  You have low back pain that you never had before.  You feel your baby's head pushing down and causing pelvic pressure.  Your baby is not moving inside you as much as it used to. Summary  Contractions that occur before labor are called Braxton Hicks contractions, false labor, or practice contractions.  Braxton Hicks contractions are usually shorter, weaker, farther apart, and less regular than true labor contractions. True labor contractions usually become progressively stronger and regular, and they become more frequent.  Manage discomfort from Braxton Hicks contractions by changing position, resting in a warm bath, drinking plenty of water, or practicing deep breathing. This information is not intended to replace advice given to you by your health care provider. Make sure you discuss any questions you have with your health care provider. Document Released: 02/16/2017 Document Revised: 07/18/2017 Document Reviewed: 02/16/2017 Elsevier Interactive Patient Education  2019 Elsevier Inc.  

## 2018-12-28 NOTE — Progress Notes (Signed)
ROB.  C/o spotting from rectum on Wednesday.  Denies pain, LOF, fever, chills, NV.

## 2018-12-28 NOTE — Progress Notes (Signed)
   PRENATAL VISIT NOTE  Subjective:  Courtney Lyons is a 28 y.o. [id number] at [redacted]w[redacted]d being seen today for ongoing prenatal care.  She is currently monitored for the following issues for this low-risk pregnancy and has Supervision of other normal pregnancy, antepartum; Obesity during pregnancy, antepartum; Gonorrhea of mother during pregnancy, first trimester; Iron deficiency anemia of pregnancy; and Large for gestational age fetus affecting management of mother on their problem list.  Patient reports no complaints.  Contractions: Irritability. Vag. Bleeding: None.  Movement: Present. Denies leaking of fluid.   The following portions of the patient's history were reviewed and updated as appropriate: allergies, current medications, past family history, past medical history, past social history, past surgical history and problem list.   Objective:   Vitals:   12/28/18 0940  BP: 116/69  Pulse: 75  Weight: 232 lb 4.8 oz (105.4 kg)    Fetal Status: Fetal Heart Rate (bpm): 143 Fundal Height: 34 cm Movement: Present     General:  Alert, oriented and cooperative. Patient is in no acute distress.  Skin: Skin is warm and dry. No rash noted.   Cardiovascular: Normal heart rate noted  Respiratory: Normal respiratory effort, no problems with respiration noted  Abdomen: Soft, gravid, appropriate for gestational age.  Pain/Pressure: Present     Pelvic: Cervical exam deferred        Extremities: Normal range of motion.  Edema: Trace  Mental Status: Normal mood and affect. Normal behavior. Normal judgment and thought content.   Assessment and Plan:  Pregnancy: [id number] at [redacted]w[redacted]d 1. Supervision of other normal pregnancy, antepartum  2. Obesity during pregnancy, antepartum - Discussed weight gain and diet recommendations   3. Iron deficiency anemia of pregnancy - Unable to tolerate iron, increased iron in diet recently, will recheck at 36 weeks  Preterm labor symptoms and general obstetric  precautions including but not limited to vaginal bleeding, contractions, leaking of fluid and fetal movement were reviewed in detail with the patient. Please refer to After Visit Summary for other counseling recommendations.   Return in about 2 weeks (around 01/11/2019) for LOB.  No future appointments.  Vonzella Nipple, PA-C

## 2019-01-10 ENCOUNTER — Other Ambulatory Visit: Payer: Self-pay

## 2019-01-10 ENCOUNTER — Encounter: Payer: Self-pay | Admitting: Certified Nurse Midwife

## 2019-01-10 ENCOUNTER — Ambulatory Visit (INDEPENDENT_AMBULATORY_CARE_PROVIDER_SITE_OTHER): Payer: Medicaid Other | Admitting: Certified Nurse Midwife

## 2019-01-10 DIAGNOSIS — O99019 Anemia complicating pregnancy, unspecified trimester: Secondary | ICD-10-CM

## 2019-01-10 DIAGNOSIS — D509 Iron deficiency anemia, unspecified: Secondary | ICD-10-CM

## 2019-01-10 DIAGNOSIS — O99213 Obesity complicating pregnancy, third trimester: Secondary | ICD-10-CM

## 2019-01-10 DIAGNOSIS — O99013 Anemia complicating pregnancy, third trimester: Secondary | ICD-10-CM

## 2019-01-10 DIAGNOSIS — O99210 Obesity complicating pregnancy, unspecified trimester: Secondary | ICD-10-CM

## 2019-01-10 DIAGNOSIS — Z3480 Encounter for supervision of other normal pregnancy, unspecified trimester: Secondary | ICD-10-CM

## 2019-01-10 DIAGNOSIS — Z3A33 33 weeks gestation of pregnancy: Secondary | ICD-10-CM

## 2019-01-10 NOTE — Progress Notes (Signed)
   TELEHEALTH VIRTUAL OBSTETRICS VISIT ENCOUNTER NOTE  I connected with Courtney Lyons on 01/10/19 at  9:41 AM EDT by telephone at home and verified that I am speaking with the correct person using two identifiers.   I discussed the limitations, risks, security and privacy concerns of performing an evaluation and management service by telephone and the availability of in person appointments. I also discussed with the patient that there may be a patient responsible charge related to this service. The patient expressed understanding and agreed to proceed.  Subjective:  Courtney Lyons is a 28 y.o. [id number] at [redacted]w[redacted]d being followed for ongoing prenatal care.  She is currently monitored for the following issues for this low-risk pregnancy and has Supervision of other normal pregnancy, antepartum; Obesity during pregnancy, antepartum; Gonorrhea of mother during pregnancy, first trimester; Iron deficiency anemia of pregnancy; and Large for gestational age fetus affecting management of mother on their problem list.  Patient reports fatigue and constipation. Reports fetal movement. Denies any contractions, bleeding or leaking of fluid.   The following portions of the patient's history were reviewed and updated as appropriate: allergies, current medications, past family history, past medical history, past social history, past surgical history and problem list.   Objective:   General:  Alert, oriented and cooperative.   Mental Status: Normal mood and affect perceived. Normal judgment and thought content.  Rest of physical exam deferred due to type of encounter  Assessment and Plan:  Pregnancy: [id number] at [redacted]w[redacted]d 1. Supervision of other normal pregnancy, antepartum - Patient doing well, reports fatigue, difficulty sleeping and constipation  - Discussed use of Magnesium supplement 250mg  for constipation and sleep - Educated and discussed recommendations of COVID19 with patient including hand washing and  social distancing  - Anticipatory guidance on upcoming appointments with GBS and GC/C at next appointment  - Routine prenatal care   2. Obesity during pregnancy, antepartum - TWG 27 lbs during pregnancy  - Follow up US for fetal growth ordered due to size >dates at last 2 appointments  - [place] [facility] OB FOLLOW UP; Future  3. Iron deficiency anemia of pregnancy - Patient reports she stopped taking supplement as it was making her constipated and strain.  - Repeat CBC at next appointment   Preterm labor symptoms and general obstetric precautions including but not limited to vaginal bleeding, contractions, leaking of fluid and fetal movement were reviewed in detail with the patient.  I discussed the assessment and treatment plan with the patient. The patient was provided an opportunity to ask questions and all were answered. The patient agreed with the plan and demonstrated an understanding of the instructions. The patient was advised to call back or seek an in-person office evaluation/go to MAU at [facility] for any urgent or concerning symptoms. Please refer to After Visit Summary for other counseling recommendations.  On phone with patient for 6 minutes   Return in about 19 days (around 01/29/2019) for ROB/36weekswabs.  Sharyon Cable, CNM [facility] [employer], [facility]

## 2019-01-17 ENCOUNTER — Other Ambulatory Visit: Payer: Self-pay

## 2019-01-17 ENCOUNTER — Ambulatory Visit (HOSPITAL_COMMUNITY)
Admission: RE | Admit: 2019-01-17 | Discharge: 2019-01-17 | Disposition: A | Discharge status: Home / Self Care | Payer: Medicaid Other | Source: Ambulatory Visit | Attending: Obstetrics and Gynecology | Admitting: Obstetrics and Gynecology

## 2019-01-17 DIAGNOSIS — Z3A34 34 weeks gestation of pregnancy: Secondary | ICD-10-CM | POA: Diagnosis not present

## 2019-01-17 DIAGNOSIS — O26843 Uterine size-date discrepancy, third trimester: Secondary | ICD-10-CM

## 2019-01-17 DIAGNOSIS — O99210 Obesity complicating pregnancy, unspecified trimester: Secondary | ICD-10-CM | POA: Diagnosis present

## 2019-01-17 DIAGNOSIS — O99213 Obesity complicating pregnancy, third trimester: Secondary | ICD-10-CM | POA: Diagnosis not present

## 2019-01-17 DIAGNOSIS — Z362 Encounter for other antenatal screening follow-up: Secondary | ICD-10-CM | POA: Diagnosis not present

## 2019-01-24 ENCOUNTER — Ambulatory Visit (HOSPITAL_COMMUNITY): Payer: Medicaid Other

## 2019-01-29 ENCOUNTER — Encounter: Payer: Self-pay | Admitting: Advanced Practice Midwife

## 2019-01-29 ENCOUNTER — Other Ambulatory Visit (HOSPITAL_COMMUNITY)
Admission: RE | Admit: 2019-01-29 | Discharge: 2019-01-29 | Disposition: A | Discharge status: Home / Self Care | Payer: Medicaid Other | Source: Ambulatory Visit | Attending: Advanced Practice Midwife | Admitting: Advanced Practice Midwife

## 2019-01-29 ENCOUNTER — Ambulatory Visit (INDEPENDENT_AMBULATORY_CARE_PROVIDER_SITE_OTHER): Payer: Medicaid Other | Admitting: Advanced Practice Midwife

## 2019-01-29 ENCOUNTER — Other Ambulatory Visit: Payer: Self-pay

## 2019-01-29 VITALS — BP 136/95 | HR 85 | Wt 234.0 lb

## 2019-01-29 DIAGNOSIS — Z3A36 36 weeks gestation of pregnancy: Secondary | ICD-10-CM

## 2019-01-29 DIAGNOSIS — O133 Gestational [pregnancy-induced] hypertension without significant proteinuria, third trimester: Secondary | ICD-10-CM

## 2019-01-29 DIAGNOSIS — Z3480 Encounter for supervision of other normal pregnancy, unspecified trimester: Secondary | ICD-10-CM

## 2019-01-29 NOTE — Progress Notes (Signed)
Per last notes repeat CBC today.

## 2019-01-29 NOTE — Patient Instructions (Signed)
Hypertension During Pregnancy ° °Hypertension, commonly called high blood pressure, is when the force of blood pumping through your arteries is too strong. Arteries are blood vessels that carry blood from the heart throughout the body. Hypertension during pregnancy can cause problems for you and your baby. Your baby may be born early (prematurely) or may not weigh as much as he or she should at birth. Very bad cases of hypertension during pregnancy can be life-threatening. °Different types of hypertension can occur during pregnancy. These include: °· Chronic hypertension. This happens when: °? You have hypertension before pregnancy and it continues during pregnancy. °? You develop hypertension before you are [redacted] weeks pregnant, and it continues during pregnancy. °· Gestational hypertension. This is hypertension that develops after the 20th week of pregnancy. °· Preeclampsia, also called toxemia of pregnancy. This is a very serious type of hypertension that develops during pregnancy. It can be very dangerous for you and your baby. °? In rare cases, you may develop preeclampsia after giving birth (postpartum preeclampsia). This usually occurs within 48 hours after childbirth but may occur up to 6 weeks after giving birth. °Gestational hypertension and preeclampsia usually go away within 6 weeks after your baby is born. Women who have hypertension during pregnancy have a greater chance of developing hypertension later in life or during future pregnancies. °What are the causes? °The exact cause of hypertension during pregnancy is not known. °What increases the risk? °There are certain factors that make it more likely for you to develop hypertension during pregnancy. These include: °· Having hypertension during a previous pregnancy or prior to pregnancy. °· Being overweight. °· Being age 35 or older. °· Being pregnant for the first time. °· Being pregnant with more than one baby. °· Becoming pregnant using fertilization  methods such as IVF (in vitro fertilization). °· Having diabetes, kidney problems, or systemic lupus erythematosus. °· Having a family history of hypertension. °What are the signs or symptoms? °Chronic hypertension and gestational hypertension rarely cause symptoms. Preeclampsia causes symptoms, which may include: °· Increased protein in your urine. Your health care provider will check for this at every visit before you give birth (prenatal visit). °· Severe headaches. °· Sudden weight gain. °· Swelling of the hands, face, legs, and feet. °· Nausea and vomiting. °· Vision problems, such as blurred or double vision. °· Numbness in the face, arms, legs, and feet. °· Dizziness. °· Slurred speech. °· Sensitivity to bright lights. °· Abdominal pain. °· Convulsions or seizures. °How is this diagnosed? °You may be diagnosed with hypertension during a routine prenatal exam. At each prenatal visit, you may: °· Have a urine test to check for high amounts of protein in your urine. °· Have your blood pressure checked. A blood pressure reading is given as two numbers, such as "120 over 80" (or 120/80). The first ("top") number is a measure of the pressure in your arteries when your heart beats (systolic pressure). The second ("bottom") number is a measure of the pressure in your arteries as your heart relaxes between beats (diastolic pressure). Blood pressure is measured in a unit called mm Hg. For most women, a normal blood pressure reading is: °? Systolic: below 120. °? Diastolic: below 80. °The type of hypertension that you are diagnosed with depends on your test results and when your symptoms developed. °· Chronic hypertension is usually diagnosed before 20 weeks of pregnancy. °· Gestational hypertension is usually diagnosed after 20 weeks of pregnancy. °· Hypertension with high amounts of protein in   the urine is diagnosed as preeclampsia. °· Blood pressure measurements that stay above 160 systolic, or above 110 diastolic,  are signs of severe preeclampsia. °How is this treated? °Treatment for hypertension during pregnancy varies depending on the type of hypertension you have and how serious it is. °· If you take medicines called ACE inhibitors to treat chronic hypertension, you may need to switch medicines. ACE inhibitors should not be taken during pregnancy. °· If you have gestational hypertension, you may need to take blood pressure medicine. °· If you are at risk for preeclampsia, your health care provider may recommend that you take a low-dose aspirin during your pregnancy. °· If you have severe preeclampsia, you may need to be hospitalized so you and your baby can be monitored closely. You may also need to take medicine (magnesium sulfate) to prevent seizures and to lower blood pressure. This medicine may be given as an injection or through an IV. °· In some cases, if your condition gets worse, you may need to deliver your baby early. °Follow these instructions at home: °Eating and drinking ° °· Drink enough fluid to keep your urine pale yellow. °· Avoid caffeine. °Lifestyle °· Do not use any products that contain nicotine or tobacco, such as cigarettes and e-cigarettes. If you need help quitting, ask your health care provider. °· Do not use alcohol or drugs. °· Avoid stress as much as possible. Rest and get plenty of sleep. °General instructions °· Take over-the-counter and prescription medicines only as told by your health care provider. °· While lying down, lie on your left side. This keeps pressure off your major blood vessels. °· While sitting or lying down, raise (elevate) your feet. Try putting some pillows under your lower legs. °· Exercise regularly. Ask your health care provider what kinds of exercise are best for you. °· Keep all prenatal and follow-up visits as told by your health care provider. This is important. °Contact a health care provider if: °· You have symptoms that your health care provider told you may  require more treatment or monitoring, such as: °? Nausea or vomiting. °? Headache. °Get help right away if you have: °· Severe abdominal pain that does not get better with treatment. °· A severe headache that does not get better. °· Vomiting that does not get better. °· Sudden, rapid weight gain. °· Sudden swelling in your hands, ankles, or face. °· Vaginal bleeding. °· Blood in your urine. °· Fewer movements from your baby than usual. °· Blurred or double vision. °· Muscle twitching or sudden muscle tightening (spasms). °· Shortness of breath. °· Blue fingernails or lips. °Summary °· Hypertension, commonly called high blood pressure, is when the force of blood pumping through your arteries is too strong. °· Hypertension during pregnancy can cause problems for you and your baby. °· Treatment for hypertension during pregnancy varies depending on the type of hypertension you have and how serious it is. °· Get help right away if you have symptoms that your health care provider told you to watch for. °This information is not intended to replace advice given to you by your health care provider. Make sure you discuss any questions you have with your health care provider. °Document Released: 06/21/2011 Document Revised: 09/19/2017 Document Reviewed: 03/18/2016 °Elsevier Interactive Patient Education © 2019 [employer] ° °

## 2019-01-29 NOTE — Progress Notes (Signed)
   PRENATAL VISIT NOTE  Subjective:  Courtney Lyons is a 28 y.o. [id number] at [redacted]w[redacted]d being seen today for ongoing prenatal care.  She is currently monitored for the following issues for this low-risk pregnancy and has Supervision of other normal pregnancy, antepartum; Obesity during pregnancy, antepartum; Gonorrhea of mother during pregnancy, first trimester; Iron deficiency anemia of pregnancy; and Large for gestational age fetus affecting management of mother on their problem list.  Patient reports occasional contractions.  Contractions: Irritability. Vag. Bleeding: None.  Movement: Present. Denies leaking of fluid.   The following portions of the patient's history were reviewed and updated as appropriate: allergies, current medications, past family history, past medical history, past social history, past surgical history and problem list.   Objective:   Vitals:   01/29/19 1055  BP: (!) 136/95  Pulse: 85  Weight: 106.1 kg    Fetal Status: Fetal Heart Rate (bpm): 142 Fundal Height: 35 cm Movement: Present  Presentation: Vertex  General:  Alert, oriented and cooperative. Patient is in no acute distress.  Skin: Skin is warm and dry. No rash noted.   Cardiovascular: Normal heart rate noted  Respiratory: Normal respiratory effort, no problems with respiration noted  Abdomen: Soft, gravid, appropriate for gestational age.  Pain/Pressure: Present     Pelvic: Cervical exam performed Dilation: Closed Effacement (%): 0 Station: Ballotable  Extremities: Normal range of motion.  Edema: Trace  Mental Status: Normal mood and affect. Normal behavior. Normal judgment and thought content.   Assessment and Plan:  Pregnancy: [id number] at [redacted]w[redacted]d  1. Supervision of other normal pregnancy, antepartum --Anticipatory guidance about next visits/weeks of pregnancy given. --Reviewed safety, visitor policy, reassurance about COVID-19 for pregnancy at this time. Discussed possible changes to visits, including  televisits, that may occur due to COVID-19.  The office remains open if pt needs to be seen and MAU is open 24 hours/day for OB emergencies.  - Strep Gp B NAA - GC/Chlamydia probe amp ([place])not at [facility] - Enroll Patient in Babyscripts - Babyscripts Schedule Optimization  2. Transient hypertension of pregnancy, antepartum, third trimester --Pt was stressed when she came in because she had her children and did not know they could not come back with her. Her 38 and 30 year old are waiting outside the office for her.  She reported mild h/a when her BP was taken but now that she is resting, and she knows her kids are [facility], her h/a has resolved. --Intake BP 136/95, retake 125/86. --S/sx of PEC/reasons to seek care reviewed --Televisit with home BP next week using Babyscripts  Term labor symptoms and general obstetric precautions including but not limited to vaginal bleeding, contractions, leaking of fluid and fetal movement were reviewed in detail with the patient. Please refer to After Visit Summary for other counseling recommendations.   Return in about 1 week (around 02/05/2019).  No future appointments.  Sharen Counter, CNM

## 2019-01-30 LAB — CERVICOVAGINAL ANCILLARY ONLY
Chlamydia: NEGATIVE
Neisseria Gonorrhea: NEGATIVE

## 2019-01-30 LAB — GC/CHLAMYDIA PROBE AMP (~~LOC~~) NOT AT ARMC
Chlamydia: NEGATIVE
Neisseria Gonorrhea: NEGATIVE

## 2019-01-30 MED ORDER — BLOOD PRESSURE MONITOR KIT: PACK | 0 refills | Status: DC

## 2019-01-30 NOTE — Addendum Note (Signed)
Addended by: Kennon Portela on: 01/30/2019 02:23 PM   Modules accepted: Orders

## 2019-01-31 LAB — STREP GP B NAA: Strep Gp B NAA: NEGATIVE

## 2019-02-04 ENCOUNTER — Other Ambulatory Visit: Payer: Self-pay

## 2019-02-04 ENCOUNTER — Ambulatory Visit (INDEPENDENT_AMBULATORY_CARE_PROVIDER_SITE_OTHER): Payer: Medicaid Other | Admitting: Advanced Practice Midwife

## 2019-02-04 DIAGNOSIS — Z3483 Encounter for supervision of other normal pregnancy, third trimester: Secondary | ICD-10-CM | POA: Diagnosis not present

## 2019-02-04 DIAGNOSIS — Z3A36 36 weeks gestation of pregnancy: Secondary | ICD-10-CM

## 2019-02-04 DIAGNOSIS — Z3480 Encounter for supervision of other normal pregnancy, unspecified trimester: Secondary | ICD-10-CM

## 2019-02-04 NOTE — Progress Notes (Signed)
   TELEHEALTH VIRTUAL OBSTETRICS VISIT ENCOUNTER NOTE  I connected with Courtney Lyons on 02/04/19 at  3:20 PM EDT by telephone at home and verified that I am speaking with the correct person using two identifiers.   I discussed the limitations, risks, security and privacy concerns of performing an evaluation and management service by telephone and the availability of in person appointments. I also discussed with the patient that there may be a patient responsible charge related to this service. The patient expressed understanding and agreed to proceed.  Subjective:  Courtney Lyons is a 28 y.o. [id number] at [redacted]w[redacted]d being followed for ongoing prenatal care.  She is currently monitored for the following issues for this low-risk pregnancy and has Supervision of other normal pregnancy, antepartum; Obesity during pregnancy, antepartum; Gonorrhea of mother during pregnancy, first trimester; Iron deficiency anemia of pregnancy; and Large for gestational age fetus affecting management of mother on their problem list.  Patient reports occasional contractions. Reports fetal movement. Denies any contractions, bleeding or leaking of fluid.   The following portions of the patient's history were reviewed and updated as appropriate: allergies, current medications, past family history, past medical history, past social history, past surgical history and problem list.   Objective:   General:  Alert, oriented and cooperative.   Mental Status: Normal mood and affect perceived. Normal judgment and thought content.  Rest of physical exam deferred due to type of encounter  Assessment and Plan:  Pregnancy: [id number] at [redacted]w[redacted]d 1. Supervision of other normal pregnancy, antepartum --Anticipatory guidance about next visits/weeks of pregnancy given. --Reviewed safety, visitor policy, reassurance about COVID-19 for pregnancy at this time. Discussed possible changes to visits, including televisits, that may occur due to  COVID-19.  The office remains open if pt needs to be seen and MAU is open 24 hours/day for OB emergencies.   --Pt has Babyscripts cuff, numbers not transferring to Epic.  BP <140/90 per pt.  S/sx of PEC/reasons to seek care reviewed.  Term labor symptoms and general obstetric precautions including but not limited to vaginal bleeding, contractions, leaking of fluid and fetal movement were reviewed in detail with the patient.  I discussed the assessment and treatment plan with the patient. The patient was provided an opportunity to ask questions and all were answered. The patient agreed with the plan and demonstrated an understanding of the instructions. The patient was advised to call back or seek an in-person office evaluation/go to MAU at [facility] for any urgent or concerning symptoms. Please refer to After Visit Summary for other counseling recommendations.   I provided 10 minutes of non-face-to-face time during this encounter.  Return in about 1 week (around 02/11/2019).  Future Appointments  Date Time Provider Department Center  02/12/2019  3:20 PM [place], Wilmer Floor, CNM [facility] None    Sharen Counter, CNM [facility] [employer], [facility]

## 2019-02-04 NOTE — Progress Notes (Signed)
Pt is on the phone for televisit. [redacted]w[redacted]d. Pt states she is having frequent contractions but they are not consistent or close together.

## 2019-02-12 ENCOUNTER — Ambulatory Visit (INDEPENDENT_AMBULATORY_CARE_PROVIDER_SITE_OTHER): Payer: Medicaid Other | Admitting: Advanced Practice Midwife

## 2019-02-12 ENCOUNTER — Other Ambulatory Visit: Payer: Self-pay

## 2019-02-12 DIAGNOSIS — Z3483 Encounter for supervision of other normal pregnancy, third trimester: Secondary | ICD-10-CM

## 2019-02-12 DIAGNOSIS — Z3480 Encounter for supervision of other normal pregnancy, unspecified trimester: Secondary | ICD-10-CM

## 2019-02-12 DIAGNOSIS — Z3A38 38 weeks gestation of pregnancy: Secondary | ICD-10-CM

## 2019-02-12 NOTE — Progress Notes (Signed)
Pt is on the phone preparing for Webex visit with provider. 38w. Pt has not picked her BP cuff up from the pharmacy.

## 2019-02-12 NOTE — Progress Notes (Signed)
   PRENATAL VISIT NOTE TELEHEALTH VIRTUAL OBSTETRICS VISIT ENCOUNTER NOTE  I connected with@ on 02/12/19 at  3:20 PM EDT by Webex at home and verified that I am speaking with the correct person using two identifiers.   I discussed the limitations, risks, security and privacy concerns of performing an evaluation and management service by telephone and the availability of in person appointments. I also discussed with the patient that there may be a patient responsible charge related to this service. The patient expressed understanding and agreed to proceed. Subjective:  Courtney Lyons is a 28 y.o. [id number] at [redacted]w[redacted]d being seen today for ongoing prenatal care.  She is currently monitored for the following issues for this low-risk pregnancy and has Supervision of other normal pregnancy, antepartum; Obesity during pregnancy, antepartum; Gonorrhea of mother during pregnancy, first trimester; Iron deficiency anemia of pregnancy; and Large for gestational age fetus affecting management of mother on their problem list.  Patient reports occasional contractions.  Reports fetal movement. Contractions: Irregular. Vag. Bleeding: None.  Movement: Present. Denies any contractions, bleeding or leaking of fluid.   The following portions of the patient's history were reviewed and updated as appropriate: allergies, current medications, past family history, past medical history, past social history, past surgical history and problem list.   Objective:  There were no vitals filed for this visit.  Fetal Status:     Movement: Present     General:  Alert, oriented and cooperative. Patient is in no acute distress.  Respiratory: Normal respiratory effort, no problems with respiration noted  Mental Status: Normal mood and affect. Normal behavior. Normal judgment and thought content.  Rest of physical exam deferred due to type of encounter  Assessment and Plan:  Pregnancy: [id number] at [redacted]w[redacted]d 1. Supervision of other  normal pregnancy, antepartum --Anticipatory guidance about next visits/weeks of pregnancy given. --Reviewed safety, visitor policy, reassurance about COVID-19 for pregnancy at this time. Discussed possible changes to visits, including televisits, that may occur due to COVID-19.  The office remains open if pt needs to be seen and MAU is open 24 hours/day for OB emergencies.   --Pt desires elective IOL if favorable after 39 weeks.  Next visit scheduled in office to check cervix and discuss.   Term labor symptoms and general obstetric precautions including but not limited to vaginal bleeding, contractions, leaking of fluid and fetal movement were reviewed in detail with the patient. I discussed the assessment and treatment plan with the patient. The patient was provided an opportunity to ask questions and all were answered. The patient agreed with the plan and demonstrated an understanding of the instructions. The patient was advised to call back or seek an in-person office evaluation/go to MAU at [facility] for any urgent or concerning symptoms. Please refer to After Visit Summary for other counseling recommendations.  I provided 12 minutes of non-face-to-face time during this encounter. Return in about 1 week (around 02/19/2019).  No future appointments.  Sharen Counter, CNM [facility] [employer], [facility]

## 2019-02-18 ENCOUNTER — Other Ambulatory Visit: Payer: Self-pay

## 2019-02-18 ENCOUNTER — Inpatient Hospital Stay (HOSPITAL_COMMUNITY)
Admission: AD | Admit: 2019-02-18 | Discharge: 2019-02-20 | DRG: 807 | Disposition: A | Discharge status: Home / Self Care | Payer: Medicaid Other | Attending: Obstetrics & Gynecology | Admitting: Obstetrics & Gynecology

## 2019-02-18 ENCOUNTER — Encounter: Payer: Self-pay | Admitting: Obstetrics and Gynecology

## 2019-02-18 ENCOUNTER — Ambulatory Visit (INDEPENDENT_AMBULATORY_CARE_PROVIDER_SITE_OTHER): Payer: Medicaid Other | Admitting: Obstetrics and Gynecology

## 2019-02-18 ENCOUNTER — Encounter (HOSPITAL_COMMUNITY): Payer: Self-pay | Admitting: *Deleted

## 2019-02-18 VITALS — BP 143/91 | HR 83 | Wt 239.7 lb

## 2019-02-18 DIAGNOSIS — Z3A38 38 weeks gestation of pregnancy: Secondary | ICD-10-CM

## 2019-02-18 DIAGNOSIS — O99210 Obesity complicating pregnancy, unspecified trimester: Secondary | ICD-10-CM

## 2019-02-18 DIAGNOSIS — O134 Gestational [pregnancy-induced] hypertension without significant proteinuria, complicating childbirth: Secondary | ICD-10-CM | POA: Diagnosis present

## 2019-02-18 DIAGNOSIS — Z3480 Encounter for supervision of other normal pregnancy, unspecified trimester: Secondary | ICD-10-CM

## 2019-02-18 DIAGNOSIS — O99019 Anemia complicating pregnancy, unspecified trimester: Secondary | ICD-10-CM

## 2019-02-18 DIAGNOSIS — O98211 Gonorrhea complicating pregnancy, first trimester: Secondary | ICD-10-CM

## 2019-02-18 DIAGNOSIS — O133 Gestational [pregnancy-induced] hypertension without significant proteinuria, third trimester: Secondary | ICD-10-CM

## 2019-02-18 DIAGNOSIS — O3663X0 Maternal care for excessive fetal growth, third trimester, not applicable or unspecified: Secondary | ICD-10-CM

## 2019-02-18 DIAGNOSIS — D509 Iron deficiency anemia, unspecified: Secondary | ICD-10-CM

## 2019-02-18 LAB — CBC
HCT: 33.2 % — ABNORMAL LOW (ref 36.0–46.0)
Hemoglobin: 10.7 g/dL — ABNORMAL LOW (ref 12.0–15.0)
MCH: 26.6 pg (ref 26.0–34.0)
MCHC: 32.2 g/dL (ref 30.0–36.0)
MCV: 82.6 fL (ref 80.0–100.0)
Platelets: 266 10*3/uL (ref 150–400)
RBC: 4.02 MIL/uL (ref 3.87–5.11)
RDW: 14.8 % (ref 11.5–15.5)
WBC: 8.1 10*3/uL (ref 4.0–10.5)
nRBC: 0 % (ref 0.0–0.2)

## 2019-02-18 LAB — COMPREHENSIVE METABOLIC PANEL
ALT: 19 U/L (ref 0–44)
AST: 22 U/L (ref 15–41)
Albumin: 2.8 g/dL — ABNORMAL LOW (ref 3.5–5.0)
Alkaline Phosphatase: 93 U/L (ref 38–126)
Anion gap: 10 (ref 5–15)
BUN: 10 mg/dL (ref 6–20)
CO2: 21 mmol/L — ABNORMAL LOW (ref 22–32)
Calcium: 9.4 mg/dL (ref 8.9–10.3)
Chloride: 106 mmol/L (ref 98–111)
Creatinine, Ser: 0.63 mg/dL (ref 0.44–1.00)
GFR calc Af Amer: >60 mL/min (ref >60)
GFR calc non Af Amer: >60 mL/min (ref >60)
Glucose, Bld: 100 mg/dL — ABNORMAL HIGH (ref 70–99)
Potassium: 3.6 mmol/L (ref 3.5–5.1)
Sodium: 137 mmol/L (ref 135–145)
Total Bilirubin: 0.2 mg/dL — ABNORMAL LOW (ref 0.3–1.2)
Total Protein: 6.7 g/dL (ref 6.5–8.1)

## 2019-02-18 LAB — PROTEIN / CREATININE RATIO, URINE
Creatinine, Urine: 128.61 mg/dL
Protein Creatinine Ratio: 0.09 mg/mg{Cre} (ref 0.00–0.15)
Total Protein, Urine: 12 mg/dL

## 2019-02-18 LAB — TYPE AND SCREEN
ABO/RH(D): A POS
Antibody Screen: NEGATIVE

## 2019-02-18 MED ORDER — LABETALOL HCL 5 MG/ML IV SOLN: 20.0000 mg | INTRAVENOUS | Status: DC | PRN

## 2019-02-18 MED ORDER — LABETALOL HCL 5 MG/ML IV SOLN: 80.0000 mg | INTRAVENOUS | Status: DC | PRN

## 2019-02-18 MED ORDER — OXYTOCIN BOLUS FROM INFUSION
500.0000 mL | Freq: Once | INTRAVENOUS | Status: AC
  Administered 2019-02-19: 500 mL via INTRAVENOUS

## 2019-02-18 MED ORDER — ONDANSETRON HCL 4 MG/2ML IJ SOLN: 4.0000 mg | Freq: Four times a day (QID) | INTRAMUSCULAR | Status: DC | PRN

## 2019-02-18 MED ORDER — LIDOCAINE HCL (PF) 1 % IJ SOLN: 30.0000 mL | INTRAMUSCULAR | Status: DC | PRN

## 2019-02-18 MED ORDER — LABETALOL HCL 5 MG/ML IV SOLN: 40.0000 mg | INTRAVENOUS | Status: DC | PRN

## 2019-02-18 MED ORDER — SOD CITRATE-CITRIC ACID 500-334 MG/5ML PO SOLN: 30.0000 mL | ORAL | Status: DC | PRN

## 2019-02-18 MED ORDER — BLOOD PRESSURE MONITOR KIT: PACK | 0 refills | Status: DC

## 2019-02-18 MED ORDER — HYDRALAZINE HCL 20 MG/ML IJ SOLN: 10.0000 mg | INTRAMUSCULAR | Status: DC | PRN

## 2019-02-18 MED ORDER — ACETAMINOPHEN 325 MG PO TABS: 650.0000 mg | ORAL_TABLET | ORAL | Status: DC | PRN

## 2019-02-18 MED ORDER — TERBUTALINE SULFATE 1 MG/ML IJ SOLN: 0.2500 mg | Freq: Once | INTRAMUSCULAR | Status: DC | PRN

## 2019-02-18 MED ORDER — MISOPROSTOL 25 MCG QUARTER TABLET
25.0000 ug | ORAL_TABLET | ORAL | Status: DC | PRN
  Administered 2019-02-18 – 2019-02-19 (×3): 25 ug via VAGINAL
  Filled 2019-02-18 (×3): qty 1

## 2019-02-18 MED ORDER — LACTATED RINGERS IV SOLN: 500.0000 mL | INTRAVENOUS | Status: DC | PRN

## 2019-02-18 MED ORDER — OXYTOCIN 40 UNITS IN NORMAL SALINE INFUSION - SIMPLE MED
2.5000 [IU]/h | INTRAVENOUS | Status: DC
  Filled 2019-02-18: qty 1000

## 2019-02-18 MED ORDER — OXYCODONE-ACETAMINOPHEN 5-325 MG PO TABS: 1.0000 | ORAL_TABLET | ORAL | Status: DC | PRN

## 2019-02-18 MED ORDER — ZOLPIDEM TARTRATE 5 MG PO TABS
5.0000 mg | ORAL_TABLET | Freq: Once | ORAL | Status: AC
  Administered 2019-02-18: 5 mg via ORAL
  Filled 2019-02-18: qty 1

## 2019-02-18 MED ORDER — LACTATED RINGERS IV SOLN
INTRAVENOUS | Status: DC
  Administered 2019-02-18 – 2019-02-19 (×3): via INTRAVENOUS

## 2019-02-18 MED ORDER — OXYCODONE-ACETAMINOPHEN 5-325 MG PO TABS: 2.0000 | ORAL_TABLET | ORAL | Status: DC | PRN

## 2019-02-18 NOTE — Plan of Care (Signed)

## 2019-02-18 NOTE — H&P (Addendum)
Shaylan Tutton is a 28 y.o. female [id number] with IUP at 51w6dpresenting for IOL for gestational hypertension. Pt states she has been having none contractions, associated with none vaginal bleeding for no hours..  Membranes are intact, with active fetal movement.   [facility]  Prenatal History/Complications:  Past Medical History: Past Medical History:  Diagnosis Date  . Anemia   . Pregnancy induced hypertension     Past Surgical History: No past surgical history on file.  Obstetrical History: OB History    Gravida  180  Para  2   Term  2   Preterm      AB  14   Living  2     SAB      TAB  14   Ectopic      Multiple      Live Births  2            Social History: Social History   Socioeconomic History  . Marital status: Single    Spouse name: Not on file  . Number of children: 2  . Years of education: Not on file  . Highest education level: Not on file  Occupational History  . Occupation: unemployed  Social Needs  . Financial resource strain: Hard  . Food insecurity:    Worry: Not on file    Inability: Not on file  . Transportation needs:    Medical: Not on file    Non-medical: Not on file  Tobacco Use  . Smoking status: Never Smoker  . Smokeless tobacco: Never Used  Substance and Sexual Activity  . Alcohol use: Not Currently  . Drug use: Not Currently    Types: Marijuana    Comment: stopped Aug/2019  . Sexual activity: Yes    Birth control/protection: None  Lifestyle  . Physical activity:    Days per week: Not on file    Minutes per session: Not on file  . Stress: Not on file  Relationships  . Social connections:    Talks on phone: Not on file    Gets together: Not on file    Attends religious service: Not on file    Active member of club or organization: Not on file    Attends meetings of clubs or organizations: Not on file    Relationship status: Not on file  Other Topics Concern  . Not on file  Social  History Narrative  . Not on file    Family History: Family History  Problem Relation Age of Onset  . Hypertension Mother   . Stroke Mother   . Diabetes Father   . Birth defects Brother   . Asthma Daughter   . Cancer Paternal Aunt   . Early death Paternal A45  . Vision loss Paternal Grandfather     Allergies: No Known Allergies  Medications Prior to Admission  Medication Sig Dispense Refill Last Dose  . Blood Pressure Monitor KIT 1 Device by dose not apply Route once for 1 dose Take B/P weekly 1 each 0 02/18/2019 at Unknown time  . fluticasone (FLONASE) 50 MCG/ACT nasal spray Place 2 sprays into both nostrils daily. 1 g 0 Past Week at Unknown time  . ipratropium (ATROVENT) 0.06 % nasal spray Place 2 sprays into both nostrils 4 (four) times daily. 15 mL 0 Past Week at Unknown time  . Prenat-FeFum-FePo-FA-DHA w/o A (PROVIDA DHA) 16-16-1.25-110 MG CAPS Take 1 tablet by mouth daily. 30 capsule 8 02/18/2019  at Unknown time  . Elastic Bandages & Supports (COMFORT FIT MATERNITY SUPP LG) MISC 1 Units by Does not apply route daily. (Patient not taking: Reported on 01/29/2019) 1 each 0 Not Taking  . ferrous sulfate (FERROUSUL) 325 (65 FE) MG tablet Take 1 tablet (325 mg total) by mouth daily with breakfast. (Patient not taking: Reported on 12/13/2018) 60 tablet 1 More than a month at Unknown time        Review of Systems   Constitutional: Negative for fever and chills Eyes: Negative for visual disturbances Respiratory: Negative for shortness of breath, dyspnea Cardiovascular: Negative for chest pain or palpitations  Gastrointestinal: Negative for vomiting, diarrhea and constipation.  POSITIVE for abdominal pain (contractions) Genitourinary: Negative for dysuria and urgency Musculoskeletal: Negative for back pain, joint pain, myalgias  Neurological: Negative for dizziness and headaches      Blood pressure 122/71, pulse 81, temperature 99.1 F (37.3 C), temperature source Oral, resp.  rate 20, last menstrual period 05/22/2018. General appearance: alert, cooperative and no distress Lungs: clear to auscultation bilaterally Heart: regular rate and rhythm Abdomen: soft, non-tender; bowel sounds normal Extremities: Homans sign is negative, no sign of DVT DTR's 2+ Presentation: cephalic Fetal monitoring  Baseline: 135 bpm; mod var, present acel, no decels.  Uterine activity  No contractions     Prenatal labs: ABO, Rh: --/--/PENDING (05/04 1842) Antibody: PENDING (05/04 1842) Rubella: 3.50 (11/21 1544) RPR: Non Reactive (02/13 0110)  HBsAg: Negative (11/21 1544)  HIV: Non Reactive (02/13 0110)  GBS: Negative (04/14 0127)  1 hr Glucola passed Genetic screening  negative Anatomy US normal; [place] on 4-2 showed EFW in 39 percentile and 4 lbs 12 oz.   Prenatal Transfer Tool  Maternal Diabetes: No Genetic Screening: Normal Maternal Ultrasounds/Referrals: Normal Fetal Ultrasounds or other Referrals:  None Maternal Substance Abuse:  No Significant Maternal Medications:  None Significant Maternal Lab Results: None     Results for orders placed or performed during the hospital encounter of 02/18/19 (from the past 24 hour(s))  CBC   Collection Time: 02/18/19  6:22 PM  Result Value Ref Range   WBC 8.1 4.0 - 10.5 K/uL   RBC 4.02 3.87 - 5.11 MIL/uL   Hemoglobin 10.7 (L) 12.0 - 15.0 g/dL   HCT 33.2 (L) 36.0 - 46.0 %   MCV 82.6 80.0 - 100.0 fL   MCH 26.6 26.0 - 34.0 pg   MCHC 32.2 30.0 - 36.0 g/dL   RDW 14.8 11.5 - 15.5 %   Platelets 266 150 - 400 K/uL   nRBC 0.0 0.0 - 0.2 %  Type and screen [place]   Collection Time: 02/18/19  6:42 PM  Result Value Ref Range   ABO/RH(D) PENDING    Antibody Screen PENDING    Sample Expiration      02/21/2019 Performed at [facility], [address], [place], [place] [address]     Assessment: Jinger Middlesworth is a 28 y.o. [id number] with an IUP at 54w6dpresenting for IOL for gestational hypertension.    Plan: #Labor: expectant management #Pain:  Per request #FWB Cat 1 #ID: GBS: neg #MOF:  Breast and bottle  #MOC: nuva ring at 6 weeks #Circ: no -pre e labs pending -patient is asymptomatic at this time -BP only mildly elevated; will continue to monitor to closely.  -cytotec vaginal given at [place]5/01/2019, 7:05 PM

## 2019-02-18 NOTE — Progress Notes (Signed)
Feeling crampy.  FHR Cat 1.  UI on EFM. Cx 1/50/-3/soft/very posterior.  2nd cytotec placed in posterior vaginal fornix.

## 2019-02-18 NOTE — Progress Notes (Signed)
   PRENATAL VISIT NOTE  Subjective:  Courtney Lyons is a 28 y.o. [id number] at [redacted]w[redacted]d being seen today for ongoing prenatal care.  She is currently monitored for the following issues for this low-risk pregnancy and has Supervision of other normal pregnancy, antepartum; Obesity during pregnancy, antepartum; Gonorrhea of mother during pregnancy, first trimester; Iron deficiency anemia of pregnancy; and Large for gestational age fetus affecting management of mother on their problem list.  Patient reports no complaints.  Contractions: Irregular. Vag. Bleeding: None.  Movement: Present. Denies leaking of fluid.   The following portions of the patient's history were reviewed and updated as appropriate: allergies, current medications, past family history, past medical history, past social history, past surgical history and problem list.   Objective:   Vitals:   02/18/19 1527  BP: (!) 143/91  Pulse: 83  Weight: 239 lb 11.2 oz (108.7 kg)    Fetal Status: Fetal Heart Rate (bpm): 140   Movement: Present     General:  Alert, oriented and cooperative. Patient is in no acute distress.  Skin: Skin is warm and dry. No rash noted.   Cardiovascular: Normal heart rate noted  Respiratory: Normal respiratory effort, no problems with respiration noted  Abdomen: Soft, gravid, appropriate for gestational age.  Pain/Pressure: Present     Pelvic: Cervical exam performed        Extremities: Normal range of motion.  Edema: Trace  Mental Status: Normal mood and affect. Normal behavior. Normal judgment and thought content.   Assessment and Plan:  Pregnancy: [id number] at [redacted]w[redacted]d 1. Supervision of other normal pregnancy, antepartum Patient is doing well without complaints - Blood Pressure Monitor KIT; 1 Device by dose not apply Route once for 1 dose Take B/P weekly  Dispense: 1 each; Refill: 0  2. Gestational hypertension of pregnancy, antepartum, third trimester Patient now with diagnosis of GHTN Patient instructed  to proceed to labor and delivery for IOL. Patient agrees and states that she has to go home and arrange child care for her children but will be at the hospital by 7pm. On call team notified - Blood Pressure Monitor KIT; 1 Device by dose not apply Route once for 1 dose Take B/P weekly  Dispense: 1 each; Refill: 0  Term labor symptoms and general obstetric precautions including but not limited to vaginal bleeding, contractions, leaking of fluid and fetal movement were reviewed in detail with the patient. Please refer to After Visit Summary for other counseling recommendations.   Return in about 1 week (around 02/25/2019) for WEBEX, ROB.  No future appointments.  Peggy Constant, MD  

## 2019-02-19 ENCOUNTER — Encounter: Payer: Medicaid Other | Admitting: Obstetrics and Gynecology

## 2019-02-19 ENCOUNTER — Encounter (HOSPITAL_COMMUNITY): Payer: Self-pay

## 2019-02-19 DIAGNOSIS — O134 Gestational [pregnancy-induced] hypertension without significant proteinuria, complicating childbirth: Secondary | ICD-10-CM

## 2019-02-19 DIAGNOSIS — Z3A38 38 weeks gestation of pregnancy: Secondary | ICD-10-CM

## 2019-02-19 LAB — RPR: RPR Ser Ql: NONREACTIVE

## 2019-02-19 LAB — CBC WITH DIFFERENTIAL/PLATELET
Abs Immature Granulocytes: 0.04 10*3/uL (ref 0.00–0.07)
Basophils Absolute: 0 10*3/uL (ref 0.0–0.1)
Basophils Relative: 0 %
Eosinophils Absolute: 0.1 10*3/uL (ref 0.0–0.5)
Eosinophils Relative: 1 %
HCT: 33.6 % — ABNORMAL LOW (ref 36.0–46.0)
Hemoglobin: 10.9 g/dL — ABNORMAL LOW (ref 12.0–15.0)
Immature Granulocytes: 1 %
Lymphocytes Relative: 26 %
Lymphs Abs: 2 10*3/uL (ref 0.7–4.0)
MCH: 26.7 pg (ref 26.0–34.0)
MCHC: 32.4 g/dL (ref 30.0–36.0)
MCV: 82.2 fL (ref 80.0–100.0)
Monocytes Absolute: 0.4 10*3/uL (ref 0.1–1.0)
Monocytes Relative: 5 %
Neutro Abs: 5.3 10*3/uL (ref 1.7–7.7)
Neutrophils Relative %: 67 %
Platelets: 254 10*3/uL (ref 150–400)
RBC: 4.09 MIL/uL (ref 3.87–5.11)
RDW: 14.9 % (ref 11.5–15.5)
WBC: 7.9 10*3/uL (ref 4.0–10.5)
nRBC: 0 % (ref 0.0–0.2)

## 2019-02-19 LAB — ABO/RH: ABO/RH(D): A POS

## 2019-02-19 MED ORDER — LACTATED RINGERS IV SOLN
500.0000 mL | Freq: Once | INTRAVENOUS | Status: AC
  Administered 2019-02-19: 500 mL via INTRAVENOUS

## 2019-02-19 MED ORDER — MEASLES, MUMPS & RUBELLA VAC IJ SOLR: 0.5000 mL | Freq: Once | INTRAMUSCULAR | Status: DC

## 2019-02-19 MED ORDER — SIMETHICONE 80 MG PO CHEW: 80.0000 mg | CHEWABLE_TABLET | ORAL | Status: DC | PRN

## 2019-02-19 MED ORDER — DOCUSATE SODIUM 100 MG PO CAPS
100.0000 mg | ORAL_CAPSULE | Freq: Two times a day (BID) | ORAL | Status: DC
  Administered 2019-02-19 – 2019-02-20 (×2): 100 mg via ORAL
  Filled 2019-02-19 (×2): qty 1

## 2019-02-19 MED ORDER — FENTANYL-BUPIVACAINE-NACL 0.5-0.125-0.9 MG/250ML-% EP SOLN
12.0000 mL/h | EPIDURAL | Status: DC | PRN
  Filled 2019-02-19: qty 250

## 2019-02-19 MED ORDER — WITCH HAZEL-GLYCERIN EX PADS: 1.0000 "application " | MEDICATED_PAD | CUTANEOUS | Status: DC | PRN

## 2019-02-19 MED ORDER — ONDANSETRON HCL 4 MG PO TABS: 4.0000 mg | ORAL_TABLET | ORAL | Status: DC | PRN

## 2019-02-19 MED ORDER — DIPHENHYDRAMINE HCL 25 MG PO CAPS: 25.0000 mg | ORAL_CAPSULE | Freq: Four times a day (QID) | ORAL | Status: DC | PRN

## 2019-02-19 MED ORDER — DIBUCAINE (PERIANAL) 1 % EX OINT: 1.0000 "application " | TOPICAL_OINTMENT | CUTANEOUS | Status: DC | PRN

## 2019-02-19 MED ORDER — FLEET ENEMA 7-19 GM/118ML RE ENEM: 1.0000 | ENEMA | Freq: Every day | RECTAL | Status: DC | PRN

## 2019-02-19 MED ORDER — PHENYLEPHRINE 40 MCG/ML (10ML) SYRINGE FOR IV PUSH (FOR BLOOD PRESSURE SUPPORT): 80.0000 ug | PREFILLED_SYRINGE | INTRAVENOUS | Status: DC | PRN

## 2019-02-19 MED ORDER — ACETAMINOPHEN 325 MG PO TABS
650.0000 mg | ORAL_TABLET | ORAL | Status: DC | PRN
  Administered 2019-02-19 (×2): 650 mg via ORAL
  Filled 2019-02-19 (×2): qty 2

## 2019-02-19 MED ORDER — BENZOCAINE-MENTHOL 20-0.5 % EX AERO: 1.0000 "application " | INHALATION_SPRAY | CUTANEOUS | Status: DC | PRN

## 2019-02-19 MED ORDER — EPHEDRINE 5 MG/ML INJ: 10.0000 mg | INTRAVENOUS | Status: DC | PRN

## 2019-02-19 MED ORDER — ONDANSETRON HCL 4 MG/2ML IJ SOLN: 4.0000 mg | INTRAMUSCULAR | Status: DC | PRN

## 2019-02-19 MED ORDER — DIPHENHYDRAMINE HCL 50 MG/ML IJ SOLN: 12.5000 mg | INTRAMUSCULAR | Status: DC | PRN

## 2019-02-19 MED ORDER — METHYLERGONOVINE MALEATE 0.2 MG PO TABS: 0.2000 mg | ORAL_TABLET | ORAL | Status: DC | PRN

## 2019-02-19 MED ORDER — IBUPROFEN 600 MG PO TABS
600.0000 mg | ORAL_TABLET | Freq: Four times a day (QID) | ORAL | Status: DC
  Administered 2019-02-19 – 2019-02-20 (×5): 600 mg via ORAL
  Filled 2019-02-19 (×5): qty 1

## 2019-02-19 MED ORDER — PRENATAL MULTIVITAMIN CH
1.0000 | ORAL_TABLET | Freq: Every day | ORAL | Status: DC
  Administered 2019-02-19 – 2019-02-20 (×2): 1 via ORAL
  Filled 2019-02-19 (×2): qty 1

## 2019-02-19 MED ORDER — TETANUS-DIPHTH-ACELL PERTUSSIS 5-2.5-18.5 LF-MCG/0.5 IM SUSP: 0.5000 mL | Freq: Once | INTRAMUSCULAR | Status: DC

## 2019-02-19 MED ORDER — ZOLPIDEM TARTRATE 5 MG PO TABS: 5.0000 mg | ORAL_TABLET | Freq: Every evening | ORAL | Status: DC | PRN

## 2019-02-19 MED ORDER — BISACODYL 10 MG RE SUPP: 10.0000 mg | Freq: Every day | RECTAL | Status: DC | PRN

## 2019-02-19 MED ORDER — FERROUS SULFATE 325 (65 FE) MG PO TABS
325.0000 mg | ORAL_TABLET | Freq: Two times a day (BID) | ORAL | Status: DC
  Administered 2019-02-19 – 2019-02-20 (×2): 325 mg via ORAL
  Filled 2019-02-19 (×2): qty 1

## 2019-02-19 MED ORDER — METHYLERGONOVINE MALEATE 0.2 MG/ML IJ SOLN: 0.2000 mg | INTRAMUSCULAR | Status: DC | PRN

## 2019-02-19 MED ORDER — COCONUT OIL OIL: 1.0000 "application " | TOPICAL_OIL | Status: DC | PRN

## 2019-02-19 MED ORDER — FENTANYL CITRATE (PF) 100 MCG/2ML IJ SOLN
50.0000 ug | INTRAMUSCULAR | Status: DC | PRN
  Administered 2019-02-19 (×2): 50 ug via INTRAVENOUS
  Filled 2019-02-19 (×2): qty 2

## 2019-02-19 NOTE — Progress Notes (Signed)
Vitals:   02/19/19 0606 02/19/19 0613  BP: (!) 175/100 (!) 156/95  Pulse: 80 84  Resp: 17 18  Temp:     Had SROM w/clear fluid an hour ago.  Wants epidural now. Cx changing quickly (now 5-6 cms, was 3cms at ROM).  Will get epidural. FHR Cat 1.

## 2019-02-19 NOTE — Lactation Note (Signed)
This note was copied from a baby's chart. Lactation Consultation Note  Patient Name: Courtney Lyons Today's Date: 02/19/2019  Initial visit made.  Mom states she plans on exclusively formula feeding.   Maternal Data Does the patient have breastfeeding experience prior to this delivery?: Yes  Feeding Feeding Type: Breast Fed  LATCH Score Latch: Repeated attempts needed to sustain latch, nipple held in mouth throughout feeding, stimulation needed to elicit sucking reflex.  Audible Swallowing: A few with stimulation  Type of Nipple: Everted at rest and after stimulation  Comfort (Breast/Nipple): Soft / non-tender  Hold (Positioning): Full assist, staff holds infant at breast  [facility] Score: 6  Interventions    Lactation Tools Discussed/Used     Consult Status      Huston Foley 02/19/2019, 10:42 AM

## 2019-02-19 NOTE — Discharge Summary (Addendum)
Postpartum Discharge Summary     Patient Name: Courtney Lyons DOB: [birth year] MRN: [id number]  Date of admission: 02/18/2019 Delivering Provider:     Date of discharge: 02/20/2019  Admitting diagnosis: Induction Intrauterine pregnancy: 2149w0d     Secondary diagnosis:  Active Problems:   Indication for care in labor and delivery, antepartum  Additional problems: GHTN     Discharge diagnosis: Term Pregnancy Delivered and Gestational Hypertension                                                                                                Post partum procedures:none  Augmentation: Cytotec  Complications: None  Hospital course:  Induction of Labor With Vaginal Delivery   28 y.o. yo [id number] at 6549w0d was admitted to the hospital 02/18/2019 for induction of labor.  Indication for induction: Gestational hypertension.  Patient had an uncomplicated labor course as follows: labored off of 3rd cytotec, delivered precipitously.  Had 1 -2 severe range BPs, but rechecks were always improved, so no meds necessary.  Membrane Rupture Time/Date: 5:30 AM ,02/19/2019   Intrapartum Procedures: Episiotomy:                                           Lacerations:    none Patient had delivery of a Viable infant.  Information for the patient's newborn:  Janie MorningComma, Boy Cesar [[id number]][[id number]]      02/19/2019  Details of delivery can be found in separate delivery note.  Patient had a routine postpartum course. Patient is discharged home 02/19/19.  Magnesium Sulfate recieved: No BMZ received: No  Physical exam  Vitals:   02/19/19 0531 02/19/19 0545 02/19/19 0606 02/19/19 0613  BP: 125/88  (!) 175/100 (!) 156/95  Pulse: 76  80 84  Resp: 18  17 18   Temp:  98.2 F (36.8 C)    TempSrc:  Oral    Weight:      Height:       General: alert, cooperative and no distress Lochia: appropriate Uterine Fundus: firm, midline, U-2 Perineum: intact DVT Evaluation: No evidence of DVT seen on physical exam. Negative Homan's  sign. No cords or calf tenderness. No significant calf/ankle edema. Labs: Lab Results  Component Value Date   WBC 7.9 02/19/2019   HGB 10.9 (L) 02/19/2019   HCT 33.6 (L) 02/19/2019   MCV 82.2 02/19/2019   PLT 254 02/19/2019   CMP Latest Ref Rng & Units 02/18/2019  Glucose 70 - 99 mg/dL 086(V100(H)  BUN 6 - 20 mg/dL 10  Creatinine 7.840.44 - 6.961.00 mg/dL 2.950.63  Sodium 284135 - 132145 mmol/L 137  Potassium 3.5 - 5.1 mmol/L 3.6  Chloride 98 - 111 mmol/L 106  CO2 22 - 32 mmol/L 21(L)  Calcium 8.9 - 10.3 mg/dL 9.4  Total Protein 6.5 - 8.1 g/dL 6.7  Total Bilirubin 0.3 - 1.2 mg/dL 4.4(W0.2(L)  Alkaline Phos 38 - 126 U/L 93  AST 15 - 41 U/L 22  ALT 0 - 44 U/L 19  Discharge instruction: per After Visit Summary and "Baby and Me Booklet".  After visit meds: Ibuprofen 600 mg po every 6 hrs prn pain        PNV 1 tab daily  Diet: routine diet  Activity: Advance as tolerated. Pelvic rest for 6 weeks.   Outpatient follow up:6 weeks Follow up Appt:No future appointments. Follow up Visit:   Please schedule this patient for Postpartum visit in: 4 weeks with the following provider: Any provider For C/S patients schedule nurse incision check in weeks 2 weeks:  High risk pregnancy complicated by: HTN Delivery mode:  SVD Anticipated Birth Control:  other/unsure PP Procedures needed: BP check in 1 week Schedule Integrated BH visit: no  Newborn Data: Live born female "Amari" Birth Weight: 6 lb 5.9 oz  APGAR: 9, 9  Newborn Delivery   Birth date/time:  02/19/2019 06:55:00 Delivery type:       Baby Feeding: Bottle and Breast Disposition:home with mother   02/20/2019 Raelyn Mora, CNM

## 2019-02-20 MED ORDER — DOCUSATE SODIUM 100 MG PO CAPS: 100.0000 mg | ORAL_CAPSULE | Freq: Two times a day (BID) | ORAL | 0 refills | Status: DC

## 2019-02-20 MED ORDER — BISACODYL 10 MG RE SUPP: 10.0000 mg | Freq: Every day | RECTAL | 0 refills | Status: DC | PRN

## 2019-02-20 MED ORDER — IBUPROFEN 600 MG PO TABS: 600.0000 mg | ORAL_TABLET | Freq: Four times a day (QID) | ORAL | 1 refills | Status: DC

## 2019-02-20 NOTE — Progress Notes (Signed)
CSW received consult for hx of marijuana use.  Referral was screened out due to the following: ~MOB had no documented substance use after initial prenatal visit/+UPT. ~MOB had no positive drug screens after initial prenatal visit/+UPT.  CSW did, however, meet with MOB as it was noted in initial [facility] visit that MOB was living in a shelter. CSW met with MOB and infant at bedside to complete assessment and offer support. MOB pleasant and easy to engage throughout assessment and was holding infant to chest, when CSW entered the room. MOB confirmed that they had been living in a shelter previously but that they received their [address] 8 voucher and moved into their apartment in February 2020. MOB confirmed that she had all essential items for infant once home and reported having a good support system consisting of her mother and FOB. MOB appeared to be in good spirits and reported feeling happy. MOB denied any further questions, concerns or need for resources from CSW at this time.   Please consult CSW if current concerns arise or by MOB's request.  Ollen Barges, [place]  [facility] [place] [phone]

## 2019-02-20 NOTE — Discharge Instructions (Signed)
Please make sure you call [facility] on Monday 02/25/2019 to let them know if your blood pressure cuff came in the mail.If you do receive it by then, you will need to check your blood pressure on Wednesday 02/27/2019. Be sure to report your blood pressure to the office and they will let you know what the next plan would be.

## 2019-02-20 NOTE — Progress Notes (Signed)
Post Partum Day 1 Subjective: Courtney Lyons  is a 28 y.o. [id number] s/p SVD at [redacted]w[redacted]d.  She reports she is doing well. No acute events overnight. She denies any problems with ambulating, voiding or po intake. Denies nausea or vomiting.  Pain is well controlled on ibuprofen.  Lochia is light and improving.  Objective: Blood pressure 133/75, pulse 68, temperature 98.5 F (36.9 C), resp. rate 18, height 5\' 4"  (1.626 m), weight 109 kg, last menstrual period 05/22/2018.  Physical Exam:  General: alert, cooperative and no distress Lochia: appropriate Uterine Fundus: firm, midline, U-2 Perineum: intact DVT Evaluation: No evidence of DVT seen on physical exam. Negative Homan's sign. No cords or calf tenderness.  Recent Labs    02/18/19 1822 02/19/19 0627  HGB 10.7* 10.9*  HCT 33.2* 33.6*    Assessment/Plan: Discharge home, Breast & Bottle feeding and Contraception Nuvaring   LOS: 2 days   Raelyn Mora, MSN, CNM 02/20/2019, 9:20 AM

## 2019-02-28 ENCOUNTER — Telehealth: Payer: Self-pay | Admitting: Obstetrics

## 2019-02-28 ENCOUNTER — Other Ambulatory Visit: Payer: Self-pay

## 2019-02-28 ENCOUNTER — Other Ambulatory Visit: Payer: Self-pay | Admitting: Obstetrics

## 2019-02-28 ENCOUNTER — Ambulatory Visit: Payer: Medicaid Other

## 2019-02-28 VITALS — BP 146/100 | HR 82

## 2019-02-28 DIAGNOSIS — Z0130 Encounter for examination of blood pressure without abnormal findings: Secondary | ICD-10-CM

## 2019-02-28 DIAGNOSIS — O165 Unspecified maternal hypertension, complicating the puerperium: Secondary | ICD-10-CM

## 2019-02-28 MED ORDER — HYDROCHLOROTHIAZIDE 25 MG PO TABS: 25.0000 mg | ORAL_TABLET | Freq: Every day | ORAL | 2 refills | Status: DC

## 2019-02-28 MED ORDER — LABETALOL HCL 200 MG PO TABS: 200.0000 mg | ORAL_TABLET | Freq: Two times a day (BID) | ORAL | 2 refills | Status: DC

## 2019-02-28 MED ORDER — AMLODIPINE BESYLATE 5 MG PO TABS: 5.0000 mg | ORAL_TABLET | Freq: Every day | ORAL | 2 refills | Status: DC

## 2019-02-28 NOTE — Progress Notes (Signed)
I have reviewed the chart and agree with nursing staff's documentation of this patient's encounter.  Coral Ceo, MD 02/28/2019 3:38 PM

## 2019-02-28 NOTE — Progress Notes (Signed)
S/w pt via televisit for pp BP check.  BP readings are as follows: 148/100, pulse 96; 149/104, pulse 94; 146/100, pulse 82....the patient was advised to be evaluated at the hospital, pt stated that she doe snot have anyone to watch her children. Pt denies any abnormal symptoms. S/w provider and he advised pt to be seen at hospital and sent rx for BP meds. Pt will follow up next week with another BP appt after taking meds, advised to be seen at hospital is symptoms develop.

## 2019-02-28 NOTE — Telephone Encounter (Signed)
Called patient for results of elevated BP checks postpartum.  Medication prescribed and patient scheduled for follow up Televisit BP check in 1 week.  Pre-eclampsia precautions given.  Patient stated that she had the same problem after last delivery and had to take medication for a month.  She agreed with management.

## 2019-03-14 ENCOUNTER — Telehealth: Payer: Self-pay | Admitting: Obstetrics

## 2019-03-20 ENCOUNTER — Other Ambulatory Visit: Payer: Self-pay

## 2019-03-20 ENCOUNTER — Ambulatory Visit: Payer: Medicaid Other

## 2019-10-18 NOTE — L&D Delivery Note (Signed)
OB/GYN Faculty Practice Delivery Note  Courtney Lyons is a 29 y.o. [id number] s/p vaginal delivery at [redacted]w[redacted]d. She was admitted for IOL secondary to cHTN and FGR.   ROM: 1h 83m with clear fluid GBS Status: GBS+ s/p adequate antibiotics prior to delivery Maximum Maternal Temperature: 37F  Labor Progress: FB and cytotec were administered on admission. Pt was then transitioned to pitocin and AROM for clear fluid. She was noted to have complete cervical dilation at 0536 and then have an uncomplicated delivery as noted below.  Delivery Date/Time: 10/15/20 at 0543 Delivery: Called to room and patient was complete and pushing. Head delivered ROA. No nuchal cord present. Shoulder and body delivered in usual fashion. Infant with spontaneous cry, placed on mother's abdomen, dried and stimulated. Cord clamped x 2 after 1-minute delay, and cut by FOB under my direct supervision. Cord blood drawn. Placenta delivered spontaneously with gentle cord traction. Fundus firm with massage and Pitocin. Labia, perineum, vagina, and cervix were inspected, without evidence of lacerations.   Placenta: 3-vessel cord, intact, sent to L&D Complications: none Lacerations: none EBL: 70 ml Analgesia: IV fentanyl  Infant: female  APGARs 9 & 9  weight per medical record  Lynnda Shields, MD OB/GYN Fellow, Faculty Practice

## 2020-04-09 ENCOUNTER — Encounter: Payer: Self-pay | Admitting: Obstetrics

## 2020-04-09 ENCOUNTER — Other Ambulatory Visit: Payer: Self-pay

## 2020-04-09 ENCOUNTER — Ambulatory Visit (INDEPENDENT_AMBULATORY_CARE_PROVIDER_SITE_OTHER): Payer: Medicaid Other | Admitting: *Deleted

## 2020-04-09 ENCOUNTER — Telehealth: Payer: Self-pay | Admitting: *Deleted

## 2020-04-09 DIAGNOSIS — Z3201 Encounter for pregnancy test, result positive: Secondary | ICD-10-CM | POA: Diagnosis not present

## 2020-04-09 DIAGNOSIS — Z3200 Encounter for pregnancy test, result unknown: Secondary | ICD-10-CM

## 2020-04-09 DIAGNOSIS — Z3480 Encounter for supervision of other normal pregnancy, unspecified trimester: Secondary | ICD-10-CM

## 2020-04-09 LAB — POCT URINE PREGNANCY: Preg Test, Ur: POSITIVE — AB

## 2020-04-09 MED ORDER — PROVIDA DHA 16-16-1.25-110 MG PO CAPS: 1.0000 | ORAL_CAPSULE | Freq: Every day | ORAL | 11 refills | Status: DC

## 2020-04-09 NOTE — Telephone Encounter (Signed)
Pharmacy sent notice that PNV is not covered by pt ins plan. Pt made aware that she may want to check her ins coverage as this Rx is usually covered. Pt also advised that she may try another pharmacy as well.   Pt states understanding.

## 2020-04-09 NOTE — Progress Notes (Signed)
Courtney Lyons presents today for UPT. She has no unusual complaints.  LMP:01/25/2020 EDD: 10/31/2020     OBJECTIVE: Appears well, in no apparent distress.  OB History    Gravida  17   Para  3   Term  3   Preterm      AB  14   Living  3     SAB      TAB  14   Ectopic      Multiple  0   Live Births  3          Home UPT Result:Positive In-Office UPT result:Positive  I have reviewed the patient's medical, obstetrical, social, and family histories, and medications.   ASSESSMENT: Positive pregnancy test  PLAN Prenatal care to be completed at: [facility] NOB intake completed PNV ordered  BabyRx ordered

## 2020-04-09 NOTE — Progress Notes (Signed)
Patient was assessed and managed by nursing staff during this encounter. I have reviewed the chart and agree with the documentation and plan. I have also made any necessary editorial changes.  Jaynie Collins, MD 04/09/2020 12:46 PM

## 2020-04-28 ENCOUNTER — Other Ambulatory Visit: Payer: Self-pay

## 2020-04-28 ENCOUNTER — Encounter: Payer: Self-pay | Admitting: Obstetrics

## 2020-04-28 ENCOUNTER — Ambulatory Visit (INDEPENDENT_AMBULATORY_CARE_PROVIDER_SITE_OTHER): Payer: Medicaid Other | Admitting: Obstetrics

## 2020-04-28 ENCOUNTER — Other Ambulatory Visit (HOSPITAL_COMMUNITY)
Admission: RE | Admit: 2020-04-28 | Discharge: 2020-04-28 | Disposition: A | Discharge status: Home / Self Care | Payer: Medicaid Other | Source: Ambulatory Visit | Attending: Obstetrics | Admitting: Obstetrics

## 2020-04-28 VITALS — BP 118/70 | HR 80 | Wt 230.0 lb

## 2020-04-28 DIAGNOSIS — Z3480 Encounter for supervision of other normal pregnancy, unspecified trimester: Secondary | ICD-10-CM | POA: Insufficient documentation

## 2020-04-28 DIAGNOSIS — O99211 Obesity complicating pregnancy, first trimester: Secondary | ICD-10-CM

## 2020-04-28 DIAGNOSIS — O131 Gestational [pregnancy-induced] hypertension without significant proteinuria, first trimester: Secondary | ICD-10-CM | POA: Diagnosis not present

## 2020-04-28 DIAGNOSIS — O99210 Obesity complicating pregnancy, unspecified trimester: Secondary | ICD-10-CM

## 2020-04-28 DIAGNOSIS — Z3A13 13 weeks gestation of pregnancy: Secondary | ICD-10-CM | POA: Diagnosis not present

## 2020-04-28 DIAGNOSIS — O135 Gestational [pregnancy-induced] hypertension without significant proteinuria, complicating the puerperium: Secondary | ICD-10-CM

## 2020-04-28 DIAGNOSIS — E669 Obesity, unspecified: Secondary | ICD-10-CM

## 2020-04-28 MED ORDER — ASPIRIN 81 MG PO CHEW: 81.0000 mg | CHEWABLE_TABLET | Freq: Every day | ORAL | 5 refills | Status: DC

## 2020-04-28 MED ORDER — VITAFOL ULTRA 29-0.6-0.4-200 MG PO CAPS: 1.0000 | ORAL_CAPSULE | Freq: Every day | ORAL | 4 refills | Status: AC

## 2020-04-28 NOTE — Progress Notes (Signed)
Subjective:    Courtney Lyons is being seen today for her first obstetrical visit.  This is a planned pregnancy. She is at [redacted]w[redacted]d gestation. Her obstetrical history is significant for obesity. Relationship with FOB: significant other, living together. Patient does intend to breast feed. Pregnancy history fully reviewed.  The information documented in the HPI was reviewed and verified.  Menstrual History: OB History    Gravida  18   Para  3   Term  3   Preterm      AB  14   Living  3     SAB      TAB  14   Ectopic      Multiple  0   Live Births  3            Patient's last menstrual period was 01/25/2020.    Past Medical History:  Diagnosis Date  . Anemia   . Pregnancy induced hypertension     History reviewed. No pertinent surgical history.  (Not in a hospital admission)  No Known Allergies  Social History   Tobacco Use  . Smoking status: Never Smoker  . Smokeless tobacco: Never Used  Substance Use Topics  . Alcohol use: Not Currently    Family History  Problem Relation Age of Onset  . Hypertension Mother   . Stroke Mother   . Diabetes Father   . Birth defects Brother   . Asthma Daughter   . Cancer Paternal Aunt   . Early death Paternal Aunt   . Vision loss Paternal Grandfather      Review of Systems Constitutional: negative for weight loss Gastrointestinal: negative for vomiting Genitourinary:negative for genital lesions and vaginal discharge and dysuria Musculoskeletal:negative for back pain Behavioral/Psych: negative for abusive relationship, depression, illegal drug usage and tobacco use    Objective:    BP 118/70   Pulse 80   Wt 230 lb (104.3 kg)   LMP 01/25/2020   BMI 39.48 kg/m  General Appearance:    Alert, cooperative, no distress, appears stated age  Head:    Normocephalic, without obvious abnormality, atraumatic  Eyes:    PERRL, conjunctiva/corneas clear, EOM's intact, fundi    benign, both eyes  Ears:    Normal TM's and  external ear canals, both ears  Nose:   Nares normal, septum midline, mucosa normal, no drainage    or sinus tenderness  Throat:   Lips, mucosa, and tongue normal; teeth and gums normal  Neck:   Supple, symmetrical, trachea midline, no adenopathy;    thyroid:  no enlargement/tenderness/nodules; no carotid   bruit or JVD  Back:     Symmetric, no curvature, ROM normal, no CVA tenderness  Lungs:     Clear to auscultation bilaterally, respirations unlabored  Chest Wall:    No tenderness or deformity   Heart:    Regular rate and rhythm, S1 and S2 normal, no murmur, rub   or gallop  Breast Exam:    No tenderness, masses, or nipple abnormality  Abdomen:     Soft, non-tender, bowel sounds active all four quadrants,    no masses, no organomegaly  Genitalia:    Normal female without lesion, discharge or tenderness  Extremities:   Extremities normal, atraumatic, no cyanosis or edema  Pulses:   2+ and symmetric all extremities  Skin:   Skin color, texture, turgor normal, no rashes or lesions  Lymph nodes:   Cervical, supraclavicular, and axillary nodes normal  Neurologic:   CNII-XII intact,  normal strength, sensation and reflexes    throughout      Lab Review Urine pregnancy test Labs reviewed yes Radiologic studies reviewed no  Assessment:    Pregnancy at [redacted]w[redacted]d weeks    Plan:     1. Supervision of other normal pregnancy, antepartum Rx: - Cytology - PAP - Cervicovaginal ancillary only( [place]) - Obstetric Panel, Including HIV - Culture, OB Urine - Genetic Screening - Prenat-Fe Poly-Methfol-FA-DHA (VITAFOL ULTRA) 29-0.6-0.4-200 MG CAPS; Take 1 capsule by mouth daily before breakfast.  Dispense: 90 capsule; Refill: 4  2. Obesity during pregnancy, antepartum Rx: - aspirin 81 MG chewable tablet; Chew 1 tablet (81 mg total) by mouth daily.  Dispense: 30 tablet; Refill: 5  3. Hypertension of pregnancy, transient, delivered w postpartum condition Rx: - aspirin 81 MG chewable tablet;  Chew 1 tablet (81 mg total) by mouth daily.  Dispense: 30 tablet; Refill: 5   Prenatal vitamins.  Counseling provided regarding continued use of seat belts, cessation of alcohol consumption, smoking or use of illicit drugs; infection precautions i.e., influenza/TDAP immunizations, toxoplasmosis,CMV, parvovirus, listeria and varicella; workplace safety, exercise during pregnancy; routine dental care, safe medications, sexual activity, hot tubs, saunas, pools, travel, caffeine use, fish and methlymercury, potential toxins, hair treatments, varicose veins Weight gain recommendations per IOM guidelines reviewed: underweight/BMI< 18.5--> gain 28 - 40 lbs; normal weight/BMI 18.5 - 24.9--> gain 25 - 35 lbs; overweight/BMI 25 - 29.9--> gain 15 - 25 lbs; obese/BMI >30->gain  11 - 20 lbs Problem list reviewed and updated. FIRST/CF mutation testing/NIPT/QUAD SCREEN/fragile X/Ashkenazi Jewish population testing/Spinal muscular atrophy discussed: requested. Role of ultrasound in pregnancy discussed; fetal survey: requested. Amniocentesis discussed: not indicated.  Meds ordered this encounter  Medications  . Prenat-Fe Poly-Methfol-FA-DHA (VITAFOL ULTRA) 29-0.6-0.4-200 MG CAPS    Sig: Take 1 capsule by mouth daily before breakfast.    Dispense:  90 capsule    Refill:  4   Orders Placed This Encounter  Procedures  . Culture, OB Urine  . Obstetric Panel, Including HIV  . Genetic Screening    Follow up in 4 weeks. 50% of 25 min visit spent on counseling and coordination of care.    Brock Bad, MD 04/28/2020 11:51 AM

## 2020-04-28 NOTE — Progress Notes (Signed)
Pt presents for NOB visit c/o N&V Pap due today

## 2020-04-29 LAB — OBSTETRIC PANEL, INCLUDING HIV
Antibody Screen: NEGATIVE
Basophils Absolute: 0 10*3/uL (ref 0.0–0.2)
Basos: 1 %
EOS (ABSOLUTE): 0.1 10*3/uL (ref 0.0–0.4)
Eos: 2 %
HIV Screen 4th Generation wRfx: NONREACTIVE
Hematocrit: 31.4 % — ABNORMAL LOW (ref 34.0–46.6)
Hemoglobin: 9.8 g/dL — ABNORMAL LOW (ref 11.1–15.9)
Hepatitis B Surface Ag: NEGATIVE
Immature Grans (Abs): 0 10*3/uL (ref 0.0–0.1)
Immature Granulocytes: 0 %
Lymphocytes Absolute: 1.5 10*3/uL (ref 0.7–3.1)
Lymphs: 23 %
MCH: 24.7 pg — ABNORMAL LOW (ref 26.6–33.0)
MCHC: 31.2 g/dL — ABNORMAL LOW (ref 31.5–35.7)
MCV: 79 fL (ref 79–97)
Monocytes Absolute: 0.4 10*3/uL (ref 0.1–0.9)
Monocytes: 6 %
Neutrophils Absolute: 4.6 10*3/uL (ref 1.4–7.0)
Neutrophils: 68 %
Platelets: 347 10*3/uL (ref 150–450)
RBC: 3.96 x10E6/uL (ref 3.77–5.28)
RDW: 16 % — ABNORMAL HIGH (ref 11.7–15.4)
RPR Ser Ql: NONREACTIVE
Rh Factor: POSITIVE
Rubella Antibodies, IGG: 3.89 index (ref >0.99)
WBC: 6.6 10*3/uL (ref 3.4–10.8)

## 2020-04-29 LAB — CERVICOVAGINAL ANCILLARY ONLY
Bacterial Vaginitis (gardnerella): POSITIVE — AB
Candida Glabrata: NEGATIVE
Candida Vaginitis: NEGATIVE
Chlamydia: POSITIVE — AB
Comment: NEGATIVE
Comment: NEGATIVE
Comment: NEGATIVE
Comment: NEGATIVE
Comment: NEGATIVE
Comment: NORMAL
Neisseria Gonorrhea: NEGATIVE
Trichomonas: NEGATIVE

## 2020-04-30 ENCOUNTER — Telehealth: Payer: Self-pay

## 2020-04-30 ENCOUNTER — Other Ambulatory Visit: Payer: Self-pay | Admitting: Obstetrics

## 2020-04-30 DIAGNOSIS — A5602 Chlamydial vulvovaginitis: Secondary | ICD-10-CM

## 2020-04-30 DIAGNOSIS — N760 Acute vaginitis: Secondary | ICD-10-CM

## 2020-04-30 MED ORDER — AZITHROMYCIN 500 MG PO TABS: 1000.0000 mg | ORAL_TABLET | Freq: Once | ORAL | 0 refills | Status: AC

## 2020-04-30 MED ORDER — METRONIDAZOLE 500 MG PO TABS: 500.0000 mg | ORAL_TABLET | Freq: Two times a day (BID) | ORAL | 2 refills | Status: DC

## 2020-04-30 NOTE — Telephone Encounter (Signed)
TC to pt to make aware of +CT  No answer lmv form faxed to health Dept

## 2020-05-01 LAB — CYTOLOGY - PAP
Comment: NEGATIVE
Diagnosis: NEGATIVE
High risk HPV: NEGATIVE

## 2020-05-02 LAB — CULTURE, OB URINE

## 2020-05-02 LAB — URINE CULTURE, OB REFLEX

## 2020-05-04 ENCOUNTER — Encounter: Payer: Self-pay | Admitting: Obstetrics

## 2020-05-04 ENCOUNTER — Other Ambulatory Visit: Payer: Self-pay | Admitting: Obstetrics

## 2020-05-04 DIAGNOSIS — O2340 Unspecified infection of urinary tract in pregnancy, unspecified trimester: Secondary | ICD-10-CM

## 2020-05-04 MED ORDER — AMOXICILLIN-POT CLAVULANATE 875-125 MG PO TABS: 1.0000 | ORAL_TABLET | Freq: Two times a day (BID) | ORAL | 0 refills | Status: DC

## 2020-05-11 ENCOUNTER — Encounter: Payer: Self-pay | Admitting: Obstetrics

## 2020-05-21 ENCOUNTER — Encounter: Payer: Self-pay | Admitting: Obstetrics

## 2020-05-21 ENCOUNTER — Telehealth: Payer: Self-pay

## 2020-05-21 LAB — HEPATITIS C ANTIBODY: Hep C Virus Ab: <0.1 s/co ratio (ref 0.0–0.9)

## 2020-05-21 LAB — SPECIMEN STATUS REPORT

## 2020-05-21 NOTE — Telephone Encounter (Signed)
Return call to pt regarding [facility] results and partner screening No answer LVM  Courtney Lyons will be reaching out to pt.

## 2020-05-26 ENCOUNTER — Encounter: Payer: Self-pay | Admitting: Obstetrics

## 2020-05-26 ENCOUNTER — Telehealth (INDEPENDENT_AMBULATORY_CARE_PROVIDER_SITE_OTHER): Payer: Medicaid Other | Admitting: Obstetrics

## 2020-05-26 VITALS — BP 132/95 | HR 116 | Wt 233.0 lb

## 2020-05-26 DIAGNOSIS — Z3480 Encounter for supervision of other normal pregnancy, unspecified trimester: Secondary | ICD-10-CM

## 2020-05-26 DIAGNOSIS — O132 Gestational [pregnancy-induced] hypertension without significant proteinuria, second trimester: Secondary | ICD-10-CM

## 2020-05-26 DIAGNOSIS — O99212 Obesity complicating pregnancy, second trimester: Secondary | ICD-10-CM

## 2020-05-26 DIAGNOSIS — O99210 Obesity complicating pregnancy, unspecified trimester: Secondary | ICD-10-CM

## 2020-05-26 DIAGNOSIS — A5609 Other chlamydial infection of lower genitourinary tract: Secondary | ICD-10-CM

## 2020-05-26 DIAGNOSIS — O139 Gestational [pregnancy-induced] hypertension without significant proteinuria, unspecified trimester: Secondary | ICD-10-CM

## 2020-05-26 DIAGNOSIS — A5602 Chlamydial vulvovaginitis: Secondary | ICD-10-CM

## 2020-05-26 DIAGNOSIS — Z3A17 17 weeks gestation of pregnancy: Secondary | ICD-10-CM

## 2020-05-26 DIAGNOSIS — O98312 Other infections with a predominantly sexual mode of transmission complicating pregnancy, second trimester: Secondary | ICD-10-CM

## 2020-05-26 NOTE — Progress Notes (Signed)
° °  OBSTETRICS PRENATAL VIRTUAL VISIT ENCOUNTER NOTE  Provider location: [facility]   I connected with Lissandra Essner on 05/26/20 at  1:00 PM EDT by MyChart Video Encounter at home and verified that I am speaking with the correct person using two identifiers.   I discussed the limitations, risks, security and privacy concerns of performing an evaluation and management service virtually and the availability of in person appointments. I also discussed with the patient that there may be a patient responsible charge related to this service. The patient expressed understanding and agreed to proceed. Subjective:  Courtney Lyons is a 29 y.o. [id number] at 103w3d being seen today for ongoing prenatal care.  She is currently monitored for the following issues for this low-risk pregnancy and has Obesity during pregnancy, antepartum; Gonorrhea of mother during pregnancy, first trimester; Iron deficiency anemia of pregnancy; Large for gestational age fetus affecting management of mother; Indication for care in labor and delivery, antepartum; and Supervision of other normal pregnancy, antepartum on their problem list.  Patient reports no complaints.  Contractions: Not present. Vag. Bleeding: None.  Movement: Present. Denies any leaking of fluid.   The following portions of the patient's history were reviewed and updated as appropriate: allergies, current medications, past family history, past medical history, past social history, past surgical history and problem list.   Objective:   Vitals:   05/26/20 1312 05/26/20 1315  BP: (!) 148/94 (!) 132/95  Pulse: (!) 116   Weight: 233 lb (105.7 kg)     Fetal Status:     Movement: Present     General:  Alert, oriented and cooperative. Patient is in no acute distress.  Respiratory: Normal respiratory effort, no problems with respiration noted  Mental Status: Normal mood and affect. Normal behavior. Normal judgment and thought content.    Rest of physical exam deferred due to type of encounter  Imaging: No results found.  Assessment and Plan:  Pregnancy: [id number] at [redacted]w[redacted]d 1. Supervision of other normal pregnancy, antepartum Rx: - [place] MFM OB COMP + 14 WK; Future  2. Hypertension in pregnancy, transient, antepartum - clinically stable - taking Baby ASA  3. Chlamydia vaginitis/cervicitis, treated  4. Obesity during pregnancy, antepartum   Preterm labor symptoms and general obstetric precautions including but not limited to vaginal bleeding, contractions, leaking of fluid and fetal movement were reviewed in detail with the patient. I discussed the assessment and treatment plan with the patient. The patient was provided an opportunity to ask questions and all were answered. The patient agreed with the plan and demonstrated an understanding of the instructions. The patient was advised to call back or seek an in-person office evaluation/go to MAU at [facility] for any urgent or concerning symptoms. Please refer to After Visit Summary for other counseling recommendations.   I provided 10 minutes of face-to-face time during this encounter.  Return in about 4 weeks (around 06/23/2020) for ROB.  No future appointments.  Coral Ceo, MD [facility], [facility] 05/26/20

## 2020-05-26 NOTE — Progress Notes (Signed)
°  Virtual Visit via Telephone Note  I connected with Courtney Lyons on 05/26/20 at  1:00 PM EDT by telephone and verified that I am speaking with the correct person using two identifiers.  Elevated BP; pt admits to eating "a lot" of bacon

## 2020-06-05 ENCOUNTER — Inpatient Hospital Stay (HOSPITAL_COMMUNITY)
Admission: EM | Admit: 2020-06-05 | Discharge: 2020-06-05 | Disposition: A | Discharge status: Home / Self Care | Payer: Medicaid Other | Attending: Obstetrics and Gynecology | Admitting: Obstetrics and Gynecology

## 2020-06-05 ENCOUNTER — Encounter (HOSPITAL_COMMUNITY): Payer: Self-pay | Admitting: Emergency Medicine

## 2020-06-05 ENCOUNTER — Inpatient Hospital Stay (HOSPITAL_BASED_OUTPATIENT_CLINIC_OR_DEPARTMENT_OTHER): Payer: Medicaid Other

## 2020-06-05 DIAGNOSIS — O99891 Other specified diseases and conditions complicating pregnancy: Secondary | ICD-10-CM

## 2020-06-05 DIAGNOSIS — R102 Pelvic and perineal pain: Secondary | ICD-10-CM | POA: Insufficient documentation

## 2020-06-05 DIAGNOSIS — R58 Hemorrhage, not elsewhere classified: Secondary | ICD-10-CM

## 2020-06-05 DIAGNOSIS — O10919 Unspecified pre-existing hypertension complicating pregnancy, unspecified trimester: Secondary | ICD-10-CM | POA: Insufficient documentation

## 2020-06-05 DIAGNOSIS — O23592 Infection of other part of genital tract in pregnancy, second trimester: Secondary | ICD-10-CM

## 2020-06-05 DIAGNOSIS — O26899 Other specified pregnancy related conditions, unspecified trimester: Secondary | ICD-10-CM

## 2020-06-05 DIAGNOSIS — R1030 Lower abdominal pain, unspecified: Secondary | ICD-10-CM | POA: Diagnosis not present

## 2020-06-05 DIAGNOSIS — O4692 Antepartum hemorrhage, unspecified, second trimester: Secondary | ICD-10-CM

## 2020-06-05 DIAGNOSIS — O26852 Spotting complicating pregnancy, second trimester: Secondary | ICD-10-CM | POA: Insufficient documentation

## 2020-06-05 DIAGNOSIS — Z3A18 18 weeks gestation of pregnancy: Secondary | ICD-10-CM

## 2020-06-05 DIAGNOSIS — Z3480 Encounter for supervision of other normal pregnancy, unspecified trimester: Secondary | ICD-10-CM

## 2020-06-05 DIAGNOSIS — O98812 Other maternal infectious and parasitic diseases complicating pregnancy, second trimester: Secondary | ICD-10-CM | POA: Diagnosis not present

## 2020-06-05 DIAGNOSIS — R109 Unspecified abdominal pain: Secondary | ICD-10-CM

## 2020-06-05 DIAGNOSIS — Z7982 Long term (current) use of aspirin: Secondary | ICD-10-CM | POA: Insufficient documentation

## 2020-06-05 DIAGNOSIS — N939 Abnormal uterine and vaginal bleeding, unspecified: Secondary | ICD-10-CM

## 2020-06-05 DIAGNOSIS — O468X2 Other antepartum hemorrhage, second trimester: Secondary | ICD-10-CM

## 2020-06-05 DIAGNOSIS — B373 Candidiasis of vulva and vagina: Secondary | ICD-10-CM

## 2020-06-05 DIAGNOSIS — O26892 Other specified pregnancy related conditions, second trimester: Secondary | ICD-10-CM | POA: Diagnosis present

## 2020-06-05 DIAGNOSIS — B3731 Acute candidiasis of vulva and vagina: Secondary | ICD-10-CM

## 2020-06-05 DIAGNOSIS — Z79899 Other long term (current) drug therapy: Secondary | ICD-10-CM | POA: Diagnosis not present

## 2020-06-05 LAB — COMPREHENSIVE METABOLIC PANEL
ALT: 13 U/L (ref 0–44)
AST: 15 U/L (ref 15–41)
Albumin: 3.1 g/dL — ABNORMAL LOW (ref 3.5–5.0)
Alkaline Phosphatase: 42 U/L (ref 38–126)
Anion gap: 10 (ref 5–15)
BUN: 5 mg/dL — ABNORMAL LOW (ref 6–20)
CO2: 23 mmol/L (ref 22–32)
Calcium: 8.7 mg/dL — ABNORMAL LOW (ref 8.9–10.3)
Chloride: 103 mmol/L (ref 98–111)
Creatinine, Ser: 0.54 mg/dL (ref 0.44–1.00)
GFR calc Af Amer: >60 mL/min (ref >60)
GFR calc non Af Amer: >60 mL/min (ref >60)
Glucose, Bld: 89 mg/dL (ref 70–99)
Potassium: 3.3 mmol/L — ABNORMAL LOW (ref 3.5–5.1)
Sodium: 136 mmol/L (ref 135–145)
Total Bilirubin: 0.4 mg/dL (ref 0.3–1.2)
Total Protein: 6.7 g/dL (ref 6.5–8.1)

## 2020-06-05 LAB — WET PREP, GENITAL
Clue Cells Wet Prep HPF POC: NONE SEEN
Sperm: NONE SEEN
Trich, Wet Prep: NONE SEEN

## 2020-06-05 LAB — CBC
HCT: 30.5 % — ABNORMAL LOW (ref 36.0–46.0)
Hemoglobin: 9.6 g/dL — ABNORMAL LOW (ref 12.0–15.0)
MCH: 24.7 pg — ABNORMAL LOW (ref 26.0–34.0)
MCHC: 31.5 g/dL (ref 30.0–36.0)
MCV: 78.6 fL — ABNORMAL LOW (ref 80.0–100.0)
Platelets: 306 10*3/uL (ref 150–400)
RBC: 3.88 MIL/uL (ref 3.87–5.11)
RDW: 17.2 % — ABNORMAL HIGH (ref 11.5–15.5)
WBC: 6.2 10*3/uL (ref 4.0–10.5)
nRBC: 0 % (ref 0.0–0.2)

## 2020-06-05 LAB — URINALYSIS, ROUTINE W REFLEX MICROSCOPIC
Bilirubin Urine: NEGATIVE
Glucose, UA: NEGATIVE mg/dL
Hgb urine dipstick: NEGATIVE
Ketones, ur: 5 mg/dL — AB
Leukocytes,Ua: NEGATIVE
Nitrite: NEGATIVE
Protein, ur: NEGATIVE mg/dL
Specific Gravity, Urine: 1.008 (ref 1.005–1.030)
pH: 7 (ref 5.0–8.0)

## 2020-06-05 LAB — PROTEIN / CREATININE RATIO, URINE
Creatinine, Urine: 81.07 mg/dL
Protein Creatinine Ratio: 0.07 mg/mg{Cre} (ref 0.00–0.15)
Total Protein, Urine: 6 mg/dL

## 2020-06-05 MED ORDER — TERCONAZOLE 0.4 % VA CREA: 1.0000 | TOPICAL_CREAM | Freq: Every day | VAGINAL | 0 refills | Status: AC

## 2020-06-05 NOTE — Discharge Instructions (Signed)
Vaginal Yeast Infection, Adult  Vaginal yeast infection is a condition that causes vaginal discharge as well as soreness, swelling, and redness (inflammation) of the vagina. This is a common condition. Some women get this infection frequently. What are the causes? This condition is caused by a change in the normal balance of the yeast (candida) and bacteria that live in the vagina. This change causes an overgrowth of yeast, which causes the inflammation. What increases the risk? The condition is more likely to develop in women who:  Take antibiotic medicines.  Have diabetes.  Take birth control pills.  Are pregnant.  Douche often.  Have a weak body defense system (immune system).  Have been taking steroid medicines for a long time.  Frequently wear tight clothing. What are the signs or symptoms? Symptoms of this condition include:  White, thick, creamy vaginal discharge.  Swelling, itching, redness, and irritation of the vagina. The lips of the vagina (vulva) may be affected as well.  Pain or a burning feeling while urinating.  Pain during sex. How is this diagnosed? This condition is diagnosed based on:  Your medical history.  A physical exam.  A pelvic exam. Your health care provider will examine a sample of your vaginal discharge under a microscope. Your health care provider may send this sample for testing to confirm the diagnosis. How is this treated? This condition is treated with medicine. Medicines may be over-the-counter or prescription. You may be told to use one or more of the following:  Medicine that is taken by mouth (orally).  Medicine that is applied as a cream (topically).  Medicine that is inserted directly into the vagina (suppository). Follow these instructions at home:  Lifestyle  Do not have sex until your health care provider approves. Tell your sex partner that you have a yeast infection. That person should go to his or her health care  provider and ask if they should also be treated.  Do not wear tight clothes, such as pantyhose or tight pants.  Wear breathable cotton underwear. General instructions  Take or apply over-the-counter and prescription medicines only as told by your health care provider.  Eat more yogurt. This may help to keep your yeast infection from returning.  Do not use tampons until your health care provider approves.  Try taking a sitz bath to help with discomfort. This is a warm water bath that is taken while you are sitting down. The water should only come up to your hips and should cover your buttocks. Do this 3-4 times per day or as told by your health care provider.  Do not douche.  If you have diabetes, keep your blood sugar levels under control.  Keep all follow-up visits as told by your health care provider. This is important. Contact a health care provider if:  You have a fever.  Your symptoms go away and then return.  Your symptoms do not get better with treatment.  Your symptoms get worse.  You have new symptoms.  You develop blisters in or around your vagina.  You have blood coming from your vagina and it is not your menstrual period.  You develop pain in your abdomen. Summary  Vaginal yeast infection is a condition that causes discharge as well as soreness, swelling, and redness (inflammation) of the vagina.  This condition is treated with medicine. Medicines may be over-the-counter or prescription.  Take or apply over-the-counter and prescription medicines only as told by your health care provider.  Do not douche.   Do not have sex or use tampons until your health care provider approves.  Contact a health care provider if your symptoms do not get better with treatment or your symptoms go away and then return. This information is not intended to replace advice given to you by your health care provider. Make sure you discuss any questions you have with your health care  provider. Document Revised: 05/03/2019 Document Reviewed: 02/19/2018 Elsevier Patient Education  2020 Elsevier Inc.  

## 2020-06-05 NOTE — ED Triage Notes (Signed)
Emergency Medicine Provider OB Triage Evaluation Note  Courtney Lyons is a 29 y.o. female, [id number], at [redacted]w[redacted]d gestation who presents to the emergency department with complaints of lower abdominal pain for 30 minutes. Followed at [facility] by Dr. Clearance Coots  Review of  Systems  Positive: abdominal pain, vaginal bleeding Negative: cough, fever, covid exposure, lightheadedness  Physical Exam  BP (!) 143/108 (BP Location: Left Arm)   Pulse 77   Temp 98.9 F (37.2 C) (Oral)   Resp 17   Ht 1.626 m (5\' 4" )   Wt 105.2 kg   LMP 01/25/2020   SpO2 100%   BMI 39.82 kg/m  General: Awake, no distress  HEENT: Atraumatic  Resp: Normal effort  Cardiac: Normal rate Abd: Nondistended, nontender  MSK: Moves all extremities without difficulty Neuro: Speech clear  Medical Decision Making  Pt evaluated for pregnancy concern and is stable for transfer to MAU. Pt is in agreement with plan for transfer.  12:19 PM Discussed with MAU APP, 03/26/2020, who accepts patient in transfer.  Clinical Impression   1. Abdominal pain affecting pregnancy   2. Vaginal bleeding        Denny Peon, MD 06/05/20 1221

## 2020-06-05 NOTE — MAU Note (Signed)
Pt transferred from [facility].Pt reports she was sleeping and woke up to a lots of heavy cramping and urge to go to the BR. She urinated and when she wiped she had bleeding when she wiped. Continues to cramp( not as intense). Gave urine sample now and reports no bleeding when wiping.  No recent intercourse.

## 2020-06-05 NOTE — MAU Provider Note (Signed)
History     CSN: [id number]  Arrival date and time: 06/05/20 1203   First Provider Initiated Contact with Patient 06/05/20 1333      Chief Complaint  Patient presents with  . Pelvic Pain   Courtney Lyons is a 29 y.o. [id number] at 4w6dwho presents today with cramping and bleeding. She states that when she woke up around 11:30 today she had a lot of cramping and then she felt something drop out. When she wiped she had a lot of pink bleeding. She is no longer having any bleeding, but the cramping has continued.   Pelvic Pain The patient's primary symptoms include pelvic pain and vaginal bleeding. The patient's pertinent negatives include no vaginal discharge. This is a new problem. The current episode started today. The problem occurs intermittently. The problem has been unchanged. The problem affects both sides. She is pregnant. Pertinent negatives include no chills, dysuria, fever, frequency, nausea or vomiting. The vaginal discharge was thin. The vaginal bleeding is spotting. Nothing aggravates the symptoms. She has tried nothing for the symptoms. Sexual activity: denies intercourse or anything in the vagina in the last 24 hours.     OB History    Gravida  18   Para  3   Term  3   Preterm      AB  14   Living  3     SAB      TAB  14   Ectopic      Multiple  0   Live Births  3           Past Medical History:  Diagnosis Date  . Anemia   . Pregnancy induced hypertension     History reviewed. No pertinent surgical history.  Family History  Problem Relation Age of Onset  . Hypertension Mother   . Stroke Mother   . Diabetes Father   . Birth defects Brother   . Asthma Daughter   . Cancer Paternal Aunt   . Early death Paternal A77  . Vision loss Paternal Grandfather     Social History   Tobacco Use  . Smoking status: Never Smoker  . Smokeless tobacco: Never Used  Vaping Use  . Vaping Use: Never used  Substance Use Topics  . Alcohol use: Not  Currently  . Drug use: Not Currently    Types: Marijuana    Comment: stopped Aug/2019    Allergies: No Known Allergies  Medications Prior to Admission  Medication Sig Dispense Refill Last Dose  . aspirin 81 MG chewable tablet Chew 1 tablet (81 mg total) by mouth daily. 30 tablet 5 06/05/2020 at Unknown time  . fluticasone (FLONASE) 50 MCG/ACT nasal spray Place 2 sprays into both nostrils daily. 1 g 0 Past Month at Unknown time  . Prenat-FeFum-FePo-FA-DHA w/o A (PROVIDA DHA) 16-16-1.25-110 MG CAPS Take 1 tablet by mouth daily. 30 capsule 11 06/05/2020 at Unknown time  . amoxicillin-clavulanate (AUGMENTIN) 875-125 MG tablet Take 1 tablet by mouth 2 (two) times daily. (Patient not taking: Reported on 05/26/2020) 14 tablet 0   . Blood Pressure Monitor KIT 1 Device by dose not apply Route once for 1 dose Take B/P weekly 1 each 0   . ipratropium (ATROVENT) 0.06 % nasal spray Place 2 sprays into both nostrils 4 (four) times daily. (Patient not taking: Reported on 04/28/2020) 15 mL 0   . metroNIDAZOLE (FLAGYL) 500 MG tablet Take 1 tablet (500 mg total) by mouth 2 (two) times daily. (Patient not  taking: Reported on 05/26/2020) 14 tablet 2   . Prenat-Fe Poly-Methfol-FA-DHA (VITAFOL ULTRA) 29-0.6-0.4-200 MG CAPS Take 1 capsule by mouth daily before breakfast. 90 capsule 4     Review of Systems  Constitutional: Negative for chills and fever.  Gastrointestinal: Negative for nausea and vomiting.  Genitourinary: Positive for pelvic pain. Negative for dysuria, frequency, vaginal bleeding and vaginal discharge.   Physical Exam   Blood pressure (!) 141/86, pulse 68, temperature 98.9 F (37.2 C), temperature source Oral, resp. rate 18, height '5\' 4"'  (1.626 m), weight 105.2 kg, last menstrual period 01/25/2020, SpO2 100 %, not currently breastfeeding.  Physical Exam Vitals and nursing note reviewed. Exam conducted with a chaperone present.  Constitutional:      Appearance: Normal appearance.  HENT:      Head: Normocephalic.  Cardiovascular:     Rate and Rhythm: Normal rate.  Pulmonary:     Effort: Pulmonary effort is normal.  Abdominal:     Palpations: Abdomen is soft.  Genitourinary:    Comments:  External: no lesion Vagina: small amount of white discharge Cervix: pink, smooth, closed/thick  Uterus: AGA  Skin:    General: Skin is warm and dry.  Neurological:     Mental Status: She is alert and oriented to person, place, and time.  Psychiatric:        Mood and Affect: Mood normal.        Behavior: Behavior normal.   FHT 144 with doppler   US shows: normal fluid, cervical length 3.64cm   Results for orders placed or performed during the hospital encounter of 06/05/20 (from the past 24 hour(s))  Urinalysis, Routine w reflex microscopic Urine, Clean Catch     Status: Abnormal   Collection Time: 06/05/20  1:35 PM  Result Value Ref Range   Color, Urine YELLOW YELLOW   APPearance HAZY (A) CLEAR   Specific Gravity, Urine 1.008 1.005 - 1.030   pH 7.0 5.0 - 8.0   Glucose, UA NEGATIVE NEGATIVE mg/dL   Hgb urine dipstick NEGATIVE NEGATIVE   Bilirubin Urine NEGATIVE NEGATIVE   Ketones, ur 5 (A) NEGATIVE mg/dL   Protein, ur NEGATIVE NEGATIVE mg/dL   Nitrite NEGATIVE NEGATIVE   Leukocytes,Ua NEGATIVE NEGATIVE  CBC     Status: Abnormal   Collection Time: 06/05/20  1:54 PM  Result Value Ref Range   WBC 6.2 4.0 - 10.5 K/uL   RBC 3.88 3.87 - 5.11 MIL/uL   Hemoglobin 9.6 (L) 12.0 - 15.0 g/dL   HCT 30.5 (L) 36 - 46 %   MCV 78.6 (L) 80.0 - 100.0 fL   MCH 24.7 (L) 26.0 - 34.0 pg   MCHC 31.5 30.0 - 36.0 g/dL   RDW 17.2 (H) 11.5 - 15.5 %   Platelets 306 150 - 400 K/uL   nRBC 0.0 0.0 - 0.2 %  Comprehensive metabolic panel     Status: Abnormal   Collection Time: 06/05/20  1:58 PM  Result Value Ref Range   Sodium 136 135 - 145 mmol/L   Potassium 3.3 (L) 3.5 - 5.1 mmol/L   Chloride 103 98 - 111 mmol/L   CO2 23 22 - 32 mmol/L   Glucose, Bld 89 70 - 99 mg/dL   BUN 5 (L) 6 - 20 mg/dL    Creatinine, Ser 0.54 0.44 - 1.00 mg/dL   Calcium 8.7 (L) 8.9 - 10.3 mg/dL   Total Protein 6.7 6.5 - 8.1 g/dL   Albumin 3.1 (L) 3.5 - 5.0 g/dL  AST 15 15 - 41 U/L   ALT 13 0 - 44 U/L   Alkaline Phosphatase 42 38 - 126 U/L   Total Bilirubin 0.4 0.3 - 1.2 mg/dL   GFR calc non Af Amer >60 >60 mL/min   GFR calc Af Amer >60 >60 mL/min   Anion gap 10 5 - 15  Wet prep, genital     Status: Abnormal   Collection Time: 06/05/20  2:21 PM   Specimen: Vaginal  Result Value Ref Range   Yeast Wet Prep HPF POC PRESENT (A) NONE SEEN   Trich, Wet Prep NONE SEEN NONE SEEN   Clue Cells Wet Prep HPF POC NONE SEEN NONE SEEN   WBC, Wet Prep HPF POC FEW (A) NONE SEEN   Sperm NONE SEEN      MAU Course  Procedures  MDM BP was elevated on 05/26/2020 132/95 and 148/94. BP is also up today. So now with 2 BP > 4 hours apart at less than 20 weeks this likely represents CHTN.   Assessment and Plan   1. Abdominal pain affecting pregnancy   2. Vaginal bleeding   3. Supervision of other normal pregnancy, antepartum   4. Bleeding   5. [redacted] weeks gestation of pregnancy   6. Yeast infection involving the vagina and surrounding area    DC home 2nd/3rd Trimester precautions  Bleeding precautions PTL precautions  Fetal kick counts RX: terazol 7 as directed  Return to MAU as needed FU with OB as planned   Follow-up [facility] Follow up.   Specialty: Obstetrics and Gynecology Contact information: [address], [address] [place]             [place], CNM  06/05/20  3:29 PM

## 2020-06-08 LAB — GC/CHLAMYDIA PROBE AMP (~~LOC~~) NOT AT ARMC
Chlamydia: NEGATIVE
Comment: NEGATIVE
Comment: NORMAL
Neisseria Gonorrhea: NEGATIVE

## 2020-06-12 ENCOUNTER — Other Ambulatory Visit: Payer: Self-pay | Admitting: *Deleted

## 2020-06-12 ENCOUNTER — Other Ambulatory Visit: Payer: Self-pay

## 2020-06-12 ENCOUNTER — Ambulatory Visit: Payer: Medicaid Other | Attending: Obstetrics

## 2020-06-12 DIAGNOSIS — O99212 Obesity complicating pregnancy, second trimester: Secondary | ICD-10-CM | POA: Diagnosis not present

## 2020-06-12 DIAGNOSIS — Z363 Encounter for antenatal screening for malformations: Secondary | ICD-10-CM | POA: Diagnosis not present

## 2020-06-12 DIAGNOSIS — Z3480 Encounter for supervision of other normal pregnancy, unspecified trimester: Secondary | ICD-10-CM

## 2020-06-12 DIAGNOSIS — Z148 Genetic carrier of other disease: Secondary | ICD-10-CM

## 2020-06-12 DIAGNOSIS — Z3A19 19 weeks gestation of pregnancy: Secondary | ICD-10-CM

## 2020-06-12 DIAGNOSIS — Z6839 Body mass index (BMI) 39.0-39.9, adult: Secondary | ICD-10-CM

## 2020-06-12 DIAGNOSIS — E669 Obesity, unspecified: Secondary | ICD-10-CM

## 2020-06-12 DIAGNOSIS — O10919 Unspecified pre-existing hypertension complicating pregnancy, unspecified trimester: Secondary | ICD-10-CM | POA: Diagnosis present

## 2020-06-12 DIAGNOSIS — Z362 Encounter for other antenatal screening follow-up: Secondary | ICD-10-CM

## 2020-06-23 ENCOUNTER — Ambulatory Visit (INDEPENDENT_AMBULATORY_CARE_PROVIDER_SITE_OTHER): Payer: Medicaid Other | Admitting: Obstetrics and Gynecology

## 2020-06-23 ENCOUNTER — Encounter: Payer: Self-pay | Admitting: Obstetrics and Gynecology

## 2020-06-23 ENCOUNTER — Other Ambulatory Visit: Payer: Self-pay

## 2020-06-23 VITALS — BP 134/89 | HR 92 | Wt 233.0 lb

## 2020-06-23 DIAGNOSIS — D509 Iron deficiency anemia, unspecified: Secondary | ICD-10-CM

## 2020-06-23 DIAGNOSIS — O10919 Unspecified pre-existing hypertension complicating pregnancy, unspecified trimester: Secondary | ICD-10-CM

## 2020-06-23 DIAGNOSIS — O99210 Obesity complicating pregnancy, unspecified trimester: Secondary | ICD-10-CM

## 2020-06-23 DIAGNOSIS — O99019 Anemia complicating pregnancy, unspecified trimester: Secondary | ICD-10-CM

## 2020-06-23 DIAGNOSIS — O0992 Supervision of high risk pregnancy, unspecified, second trimester: Secondary | ICD-10-CM

## 2020-06-23 DIAGNOSIS — Z3A21 21 weeks gestation of pregnancy: Secondary | ICD-10-CM

## 2020-06-23 NOTE — Patient Instructions (Signed)

## 2020-06-23 NOTE — Progress Notes (Signed)
  HIGH-RISK PREGNANCY OFFICE VISIT Patient name: Courtney Lyons MRN [id number]  Date of birth: Aug 06, 1991 Chief Complaint:   Routine Prenatal Visit  History of Present Illness:   Courtney Lyons is a 29 y.o. [id number] female at [redacted]w[redacted]d with an Estimated Date of Delivery: 10/31/20 being seen today for ongoing management of a high-risk pregnancy complicated by [facility] currently on no meds Today she reports no complaints. Contractions: Not present. Vag. Bleeding: None.  Movement: Present. denies leaking of fluid.  Review of Systems:   Pertinent items are noted in HPI Denies abnormal vaginal discharge w/ itching/odor/irritation, headaches, visual changes, shortness of breath, chest pain, abdominal pain, severe nausea/vomiting, or problems with urination or bowel movements unless otherwise stated above. Pertinent History Reviewed:  Reviewed past medical,surgical, social, obstetrical and family history.  Reviewed problem list, medications and allergies. Physical Assessment:   Vitals:   06/23/20 1124  BP: 134/89  Pulse: 92  Weight: 233 lb (105.7 kg)  Body mass index is 39.99 kg/m.           Physical Examination:   General appearance: alert, well appearing, and in no distress, oriented to person, place, and time and overweight  Mental status: alert, oriented to person, place, and time, normal mood, behavior, speech, dress, motor activity, and thought processes  Skin: warm & dry   Extremities: Edema: None    Cardiovascular: normal heart rate noted  Respiratory: normal respiratory effort, no distress  Abdomen: gravid, soft, non-tender  Pelvic: Cervical exam deferred         Fetal Status: Fetal Heart Rate (bpm): 148 Fundal Height: 26 cm Movement: Present    Fetal Surveillance Testing today: none   No results found for this or any previous visit (from the past 24 hour(s)).  Assessment & Plan:  1) High-risk pregnancy [id number] at [redacted]w[redacted]d with an Estimated Date of Delivery: 10/31/20   2) Supervision of  high risk pregnancy in second trimester - Next visit in 4 wks  3) [redacted] weeks gestation of pregnancy  4) Obesity during pregnancy - S>D - F/U U/S scheduled for 07/10/20  5) Chronic hypertension affecting pregnancy - Discussed since BPs were elevated prior to 20 wks, she meets the criteria for cHTN - Taking bASA  Meds: No orders of the defined types were placed in this encounter.   Labs/procedures today: none  Treatment Plan:  Resume current treatment  Reviewed: Preterm labor symptoms and general obstetric precautions including but not limited to vaginal bleeding, contractions, leaking of fluid and fetal movement were reviewed in detail with the patient.  All questions were answered. Has home bp cuff. Check bp weekly, let us know if >140/90.   Follow-up: Return in about 4 weeks (around 07/21/2020) for Return OB visit.  No orders of the defined types were placed in this encounter.  Raelyn Mora MSN, CNM 06/23/2020 11:42 AM

## 2020-06-23 NOTE — Progress Notes (Signed)
Pt presents ROB c/o HA's - admits to stressful morning today Flu vaccine offered; pt declined

## 2020-07-05 ENCOUNTER — Other Ambulatory Visit: Payer: Self-pay

## 2020-07-05 ENCOUNTER — Inpatient Hospital Stay (HOSPITAL_COMMUNITY)
Admission: AD | Admit: 2020-07-05 | Discharge: 2020-07-05 | Disposition: A | Discharge status: Home / Self Care | Payer: Medicaid Other | Attending: Obstetrics & Gynecology | Admitting: Obstetrics & Gynecology

## 2020-07-05 ENCOUNTER — Encounter (HOSPITAL_COMMUNITY): Payer: Self-pay | Admitting: Obstetrics & Gynecology

## 2020-07-05 DIAGNOSIS — O10912 Unspecified pre-existing hypertension complicating pregnancy, second trimester: Secondary | ICD-10-CM | POA: Diagnosis not present

## 2020-07-05 DIAGNOSIS — O132 Gestational [pregnancy-induced] hypertension without significant proteinuria, second trimester: Secondary | ICD-10-CM | POA: Diagnosis not present

## 2020-07-05 DIAGNOSIS — Z3A23 23 weeks gestation of pregnancy: Secondary | ICD-10-CM | POA: Diagnosis not present

## 2020-07-05 DIAGNOSIS — Z7982 Long term (current) use of aspirin: Secondary | ICD-10-CM | POA: Insufficient documentation

## 2020-07-05 LAB — COMPREHENSIVE METABOLIC PANEL
ALT: 16 U/L (ref 0–44)
AST: 24 U/L (ref 15–41)
Albumin: 2.7 g/dL — ABNORMAL LOW (ref 3.5–5.0)
Alkaline Phosphatase: 49 U/L (ref 38–126)
Anion gap: 10 (ref 5–15)
BUN: 7 mg/dL (ref 6–20)
CO2: 24 mmol/L (ref 22–32)
Calcium: 8.8 mg/dL — ABNORMAL LOW (ref 8.9–10.3)
Chloride: 105 mmol/L (ref 98–111)
Creatinine, Ser: 0.65 mg/dL (ref 0.44–1.00)
GFR calc Af Amer: >60 mL/min (ref >60)
GFR calc non Af Amer: >60 mL/min (ref >60)
Glucose, Bld: 92 mg/dL (ref 70–99)
Potassium: 3.4 mmol/L — ABNORMAL LOW (ref 3.5–5.1)
Sodium: 139 mmol/L (ref 135–145)
Total Bilirubin: 0.3 mg/dL (ref 0.3–1.2)
Total Protein: 6.8 g/dL (ref 6.5–8.1)

## 2020-07-05 LAB — CBC
HCT: 29.4 % — ABNORMAL LOW (ref 36.0–46.0)
Hemoglobin: 9.6 g/dL — ABNORMAL LOW (ref 12.0–15.0)
MCH: 25.8 pg — ABNORMAL LOW (ref 26.0–34.0)
MCHC: 32.7 g/dL (ref 30.0–36.0)
MCV: 79 fL — ABNORMAL LOW (ref 80.0–100.0)
Platelets: 295 10*3/uL (ref 150–400)
RBC: 3.72 MIL/uL — ABNORMAL LOW (ref 3.87–5.11)
RDW: 16.4 % — ABNORMAL HIGH (ref 11.5–15.5)
WBC: 6 10*3/uL (ref 4.0–10.5)
nRBC: 0 % (ref 0.0–0.2)

## 2020-07-05 LAB — PROTEIN / CREATININE RATIO, URINE
Creatinine, Urine: 152.49 mg/dL
Protein Creatinine Ratio: 0.07 mg/mg{Cre} (ref 0.00–0.15)
Total Protein, Urine: 10 mg/dL

## 2020-07-05 NOTE — MAU Note (Signed)
.  Courtney Lyons is a 30 y.o. at [redacted]w[redacted]d here in MAU reporting: elevated blood pressure and a headache. Her blood pressures at home were 165/107 to 157/102 and in the ED "it dropped down to 135/77" Headache at a 4/10. Pt reports for "a short amount of time in the ED" she saw a flash but that has resolved now. +FM. Denies vaginal bleeding/ LOF/ or CTX.  Onset of complaint: 1600 Pain score: 4/10 Vitals:   07/05/20 1930 07/05/20 2029  BP: 135/77 (!) 142/94  Pulse: 89 82  Resp: 18 17  Temp: 98.3 F (36.8 C) 98.3 F (36.8 C)  SpO2: 100% 100%     FHT: doppler 151

## 2020-07-05 NOTE — MAU Provider Note (Signed)
Patient Courtney Lyons is a 29 y.o. [id number]  at 16w1dhere with CHTN diagnosed at 21 weeks based on elevate BPs before pregnancy. She denies bleeding, LOF, decreased fetal movements, HA, blurry vision, floating spots in vision, RUQ pain, overall swelling or contractions.   She has a pregnancy history complicated by history of LGA and CHTN.  History     CSN: [id number] Arrival date and time: 07/05/20 1916   First Provider Initiated Contact with Patient 07/05/20 2056      Chief Complaint  Patient presents with  . Hypertension  . Headache   Hypertension This is a chronic problem. The current episode started today. The problem is unchanged. Pertinent negatives include no blurred vision, chest pain, headaches, shortness of breath or sweats.   She checks her BP at home "sometimes", when she checked today it was 170/100 down to 150/90s. She is asymptomatic. She decided to come in because she was concerned about her BPs at home.  OB History    Gravida  18   Para  3   Term  3   Preterm      AB  14   Living  3     SAB      TAB  14   Ectopic      Multiple  0   Live Births  3           Past Medical History:  Diagnosis Date  . Anemia   . Pregnancy induced hypertension     History reviewed. No pertinent surgical history.  Family History  Problem Relation Age of Onset  . Hypertension Mother   . Stroke Mother   . Diabetes Father   . Birth defects Brother   . Asthma Daughter   . Cancer Paternal Aunt   . Early death Paternal A20  . Vision loss Paternal Grandfather     Social History   Tobacco Use  . Smoking status: Never Smoker  . Smokeless tobacco: Never Used  Vaping Use  . Vaping Use: Never used  Substance Use Topics  . Alcohol use: Not Currently  . Drug use: Not Currently    Types: Marijuana    Comment: stopped Aug/2019    Allergies: No Known Allergies  Medications Prior to Admission  Medication Sig Dispense Refill Last Dose  . aspirin 81 MG  chewable tablet Chew 1 tablet (81 mg total) by mouth daily. 30 tablet 5 07/05/2020 at 1200  . Prenat-Fe Poly-Methfol-FA-DHA (VITAFOL ULTRA) 29-0.6-0.4-200 MG CAPS Take 1 capsule by mouth daily before breakfast. 90 capsule 4 07/05/2020 at Unknown time  . Blood Pressure Monitor KIT 1 Device by dose not apply Route once for 1 dose Take B/P weekly 1 each 0   . fluticasone (FLONASE) 50 MCG/ACT nasal spray Place 2 sprays into both nostrils daily. (Patient not taking: Reported on 06/23/2020) 1 g 0   . ipratropium (ATROVENT) 0.06 % nasal spray Place 2 sprays into both nostrils 4 (four) times daily. (Patient not taking: Reported on 04/28/2020) 15 mL 0   . Prenat-FeFum-FePo-FA-DHA w/o A (PROVIDA DHA) 16-16-1.25-110 MG CAPS Take 1 tablet by mouth daily. (Patient not taking: Reported on 06/23/2020) 30 capsule 11     Review of Systems  Constitutional: Negative.   HENT: Negative.   Eyes: Negative for blurred vision.  Respiratory: Negative for shortness of breath.   Cardiovascular: Negative for chest pain.  Genitourinary: Negative.   Musculoskeletal: Negative.   Neurological: Negative for headaches.  Hematological: Negative.  Psychiatric/Behavioral: Negative.    Physical Exam   Blood pressure (!) 149/95, pulse 72, temperature 98.3 F (36.8 C), temperature source Oral, resp. rate 17, height '5\' 4"'  (1.626 m), weight 105.9 kg, last menstrual period 01/25/2020, SpO2 100 %, not currently breastfeeding.  Physical Exam Constitutional:      Appearance: She is well-developed.  HENT:     Head: Normocephalic.  Abdominal:     Palpations: Abdomen is soft.  Skin:    General: Skin is warm.  Neurological:     Mental Status: She is alert.     MAU Course  Procedures  MDM -NST: 150 bpm mod var, present acel, no decels, no contractions -Labs drawn: all normal and patient is asymptomatic-denies HA, blurry vision, floating spots, RUQ pain, sudden all over swelling.   Patient Vitals for the past 24 hrs:  BP Temp  Temp src Pulse Resp SpO2 Height Weight  07/05/20 2145 (!) 151/90 -- -- 69 -- 100 % -- --  07/05/20 2130 (!) 140/96 -- -- 73 -- -- -- --  07/05/20 2100 (!) 149/89 -- -- 70 -- 100 % -- --  07/05/20 2044 (!) 149/95 -- -- 72 -- -- -- --  07/05/20 2029 (!) 142/94 98.3 F (36.8 C) Oral 82 17 100 % '5\' 4"'  (1.626 m) 105.9 kg  07/05/20 1930 135/77 98.3 F (36.8 C) Oral 89 18 100 % -- --     Assessment and Plan   1. Chronic hypertension in obstetric context in second trimester    2. Patient with elevated BP consistent with CHTN; no indications of pre-e at this time based on symptoms or lab work. Patient follow up on this week at scheduled appt.   3. Reviewed warning signs of pre-e in detail and when to return to MAU, all questions answered.    Mervyn Skeeters Courtney Lyons 07/05/2020, 9:06 PM

## 2020-07-05 NOTE — ED Provider Notes (Signed)
MSE was initiated and I personally evaluated the patient and placed orders (if any) at  7:27 PM on July 05, 2020.  Pt presenting to the ED with complaint of hypertension and headache.  Patient states she is 23 weeks 1 day gestation, G16-18 P3.  She has had history of high blood pressure during pregnancy though never had preeclampsia.  She states she took a nap today and when she woke up she had a terrible headache.  She checked her blood pressure and it was 170s systolic.  She rechecked it was 157/103.  She states headache resolved.  No new swelling or vision changes.  No contractions, leakage of fluids or vaginal bleeding.  BP 135/77 (BP Location: Right Arm)   Pulse 89   Temp 98.3 F (36.8 C) (Oral)   Resp 18   LMP 01/25/2020   SpO2 100%   MAU provider, [facility] Citizen, accepting transfer.  The patient appears stable so that the remainder of the MSE may be completed by another provider.   Clydette Privitera, [place] N, PA-C 07/05/20 1939    Mancel Bale, MD 07/05/20 2010

## 2020-07-10 ENCOUNTER — Other Ambulatory Visit: Payer: Self-pay | Admitting: *Deleted

## 2020-07-10 ENCOUNTER — Ambulatory Visit: Payer: Medicaid Other | Attending: Obstetrics and Gynecology

## 2020-07-10 ENCOUNTER — Ambulatory Visit: Payer: Medicaid Other | Admitting: *Deleted

## 2020-07-10 ENCOUNTER — Other Ambulatory Visit: Payer: Self-pay

## 2020-07-10 DIAGNOSIS — Z362 Encounter for other antenatal screening follow-up: Secondary | ICD-10-CM | POA: Diagnosis not present

## 2020-07-10 DIAGNOSIS — O10919 Unspecified pre-existing hypertension complicating pregnancy, unspecified trimester: Secondary | ICD-10-CM | POA: Insufficient documentation

## 2020-07-10 DIAGNOSIS — Z3480 Encounter for supervision of other normal pregnancy, unspecified trimester: Secondary | ICD-10-CM | POA: Insufficient documentation

## 2020-07-21 ENCOUNTER — Ambulatory Visit (INDEPENDENT_AMBULATORY_CARE_PROVIDER_SITE_OTHER): Payer: Medicaid Other | Admitting: Advanced Practice Midwife

## 2020-07-21 ENCOUNTER — Other Ambulatory Visit: Payer: Self-pay

## 2020-07-21 VITALS — BP 130/87 | HR 84 | Wt 233.0 lb

## 2020-07-21 DIAGNOSIS — O10912 Unspecified pre-existing hypertension complicating pregnancy, second trimester: Secondary | ICD-10-CM

## 2020-07-21 DIAGNOSIS — Z3A25 25 weeks gestation of pregnancy: Secondary | ICD-10-CM

## 2020-07-21 DIAGNOSIS — O0992 Supervision of high risk pregnancy, unspecified, second trimester: Secondary | ICD-10-CM

## 2020-07-21 NOTE — Patient Instructions (Signed)
Third Trimester of Pregnancy The third trimester is from week 28 through week 40 (months 7 through 9). The third trimester is a time when the unborn baby (fetus) is growing rapidly. At the end of the ninth month, the fetus is about 20 inches in length and weighs 6-10 pounds. Body changes during your third trimester Your body will continue to go through many changes during pregnancy. The changes vary from woman to woman. During the third trimester:  Your weight will continue to increase. You can expect to gain 25-35 pounds (11-16 kg) by the end of the pregnancy.  You may begin to get stretch marks on your hips, abdomen, and breasts.  You may urinate more often because the fetus is moving lower into your pelvis and pressing on your bladder.  You may develop or continue to have heartburn. This is caused by increased hormones that slow down muscles in the digestive tract.  You may develop or continue to have constipation because increased hormones slow digestion and cause the muscles that push waste through your intestines to relax.  You may develop hemorrhoids. These are swollen veins (varicose veins) in the rectum that can itch or be painful.  You may develop swollen, bulging veins (varicose veins) in your legs.  You may have increased body aches in the pelvis, back, or thighs. This is due to weight gain and increased hormones that are relaxing your joints.  You may have changes in your hair. These can include thickening of your hair, rapid growth, and changes in texture. Some women also have hair loss during or after pregnancy, or hair that feels dry or thin. Your hair will most likely return to normal after your baby is born.  Your breasts will continue to grow and they will continue to become tender. A yellow fluid (colostrum) may leak from your breasts. This is the first milk you are producing for your baby.  Your belly button may stick out.  You may notice more swelling in your hands,  face, or ankles.  You may have increased tingling or numbness in your hands, arms, and legs. The skin on your belly may also feel numb.  You may feel short of breath because of your expanding uterus.  You may have more problems sleeping. This can be caused by the size of your belly, increased need to urinate, and an increase in your body's metabolism.  You may notice the fetus "dropping," or moving lower in your abdomen (lightening).  You may have increased vaginal discharge.  You may notice your joints feel loose and you may have pain around your pelvic bone. What to expect at prenatal visits You will have prenatal exams every 2 weeks until week 36. Then you will have weekly prenatal exams. During a routine prenatal visit:  You will be weighed to make sure you and the baby are growing normally.  Your blood pressure will be taken.  Your abdomen will be measured to track your baby's growth.  The fetal heartbeat will be listened to.  Any test results from the previous visit will be discussed.  You may have a cervical check near your due date to see if your cervix has softened or thinned (effaced).  You will be tested for Group B streptococcus. This happens between 35 and 37 weeks. Your health care provider may ask you:  What your birth plan is.  How you are feeling.  If you are feeling the baby move.  If you have had any abnormal   symptoms, such as leaking fluid, bleeding, severe headaches, or abdominal cramping.  If you are using any tobacco products, including cigarettes, chewing tobacco, and electronic cigarettes.  If you have any questions. Other tests or screenings that may be performed during your third trimester include:  Blood tests that check for low iron levels (anemia).  Fetal testing to check the health, activity level, and growth of the fetus. Testing is done if you have certain medical conditions or if there are problems during the pregnancy.  Nonstress test  (NST). This test checks the health of your baby to make sure there are no signs of problems, such as the baby not getting enough oxygen. During this test, a belt is placed around your belly. The baby is made to move, and its heart rate is monitored during movement. What is false labor? False labor is a condition in which you feel small, irregular tightenings of the muscles in the womb (contractions) that usually go away with rest, changing position, or drinking water. These are called Braxton Hicks contractions. Contractions may last for hours, days, or even weeks before true labor sets in. If contractions come at regular intervals, become more frequent, increase in intensity, or become painful, you should see your health care provider. What are the signs of labor?  Abdominal cramps.  Regular contractions that start at 10 minutes apart and become stronger and more frequent with time.  Contractions that start on the top of the uterus and spread down to the lower abdomen and back.  Increased pelvic pressure and dull back pain.  A watery or bloody mucus discharge that comes from the vagina.  Leaking of amniotic fluid. This is also known as your "water breaking." It could be a slow trickle or a gush. Let your health care provider know if it has a color or strange odor. If you have any of these signs, call your health care provider right away, even if it is before your due date. Follow these instructions at home: Medicines  Follow your health care provider's instructions regarding medicine use. Specific medicines may be either safe or unsafe to take during pregnancy.  Take a prenatal vitamin that contains at least 600 micrograms (mcg) of folic acid.  If you develop constipation, try taking a stool softener if your health care provider approves. Eating and drinking   Eat a balanced diet that includes fresh fruits and vegetables, whole grains, good sources of protein such as meat, eggs, or tofu,  and low-fat dairy. Your health care provider will help you determine the amount of weight gain that is right for you.  Avoid raw meat and uncooked cheese. These carry germs that can cause birth defects in the baby.  If you have low calcium intake from food, talk to your health care provider about whether you should take a daily calcium supplement.  Eat four or five small meals rather than three large meals a day.  Limit foods that are high in fat and processed sugars, such as fried and sweet foods.  To prevent constipation: ? Drink enough fluid to keep your urine clear or pale yellow. ? Eat foods that are high in fiber, such as fresh fruits and vegetables, whole grains, and beans. Activity  Exercise only as directed by your health care provider. Most women can continue their usual exercise routine during pregnancy. Try to exercise for 30 minutes at least 5 days a week. Stop exercising if you experience uterine contractions.  Avoid heavy lifting.  Do   not exercise in extreme heat or humidity, or at high altitudes.  Wear low-heel, comfortable shoes.  Practice good posture.  You may continue to have sex unless your health care provider tells you otherwise. Relieving pain and discomfort  Take frequent breaks and rest with your legs elevated if you have leg cramps or low back pain.  Take warm sitz baths to soothe any pain or discomfort caused by hemorrhoids. Use hemorrhoid cream if your health care provider approves.  Wear a good support bra to prevent discomfort from breast tenderness.  If you develop varicose veins: ? Wear support pantyhose or compression stockings as told by your healthcare provider. ? Elevate your feet for 15 minutes, 3-4 times a day. Prenatal care  Write down your questions. Take them to your prenatal visits.  Keep all your prenatal visits as told by your health care provider. This is important. Safety  Wear your seat belt at all times when driving.  Make  a list of emergency phone numbers, including numbers for family, friends, the hospital, and police and fire departments. General instructions  Avoid cat litter boxes and soil used by cats. These carry germs that can cause birth defects in the baby. If you have a cat, ask someone to clean the litter box for you.  Do not travel far distances unless it is absolutely necessary and only with the approval of your health care provider.  Do not use hot tubs, steam rooms, or saunas.  Do not drink alcohol.  Do not use any products that contain nicotine or tobacco, such as cigarettes and e-cigarettes. If you need help quitting, ask your health care provider.  Do not use any medicinal herbs or unprescribed drugs. These chemicals affect the formation and growth of the baby.  Do not douche or use tampons or scented sanitary pads.  Do not cross your legs for long periods of time.  To prepare for the arrival of your baby: ? Take prenatal classes to understand, practice, and ask questions about labor and delivery. ? Make a trial run to the hospital. ? Visit the hospital and tour the maternity area. ? Arrange for maternity or paternity leave through employers. ? Arrange for family and friends to take care of pets while you are in the hospital. ? Purchase a rear-facing car seat and make sure you know how to install it in your car. ? Pack your hospital bag. ? Prepare the baby's nursery. Make sure to remove all pillows and stuffed animals from the baby's crib to prevent suffocation.  Visit your dentist if you have not gone during your pregnancy. Use a soft toothbrush to brush your teeth and be gentle when you floss. Contact a health care provider if:  You are unsure if you are in labor or if your water has broken.  You become dizzy.  You have mild pelvic cramps, pelvic pressure, or nagging pain in your abdominal area.  You have lower back pain.  You have persistent nausea, vomiting, or  diarrhea.  You have an unusual or bad smelling vaginal discharge.  You have pain when you urinate. Get help right away if:  Your water breaks before 37 weeks.  You have regular contractions less than 5 minutes apart before 37 weeks.  You have a fever.  You are leaking fluid from your vagina.  You have spotting or bleeding from your vagina.  You have severe abdominal pain or cramping.  You have rapid weight loss or weight gain.  You have   shortness of breath with chest pain.  You notice sudden or extreme swelling of your face, hands, ankles, feet, or legs.  Your baby makes fewer than 10 movements in 2 hours.  You have severe headaches that do not go away when you take medicine.  You have vision changes. Summary  The third trimester is from week 28 through week 40, months 7 through 9. The third trimester is a time when the unborn baby (fetus) is growing rapidly.  During the third trimester, your discomfort may increase as you and your baby continue to gain weight. You may have abdominal, leg, and back pain, sleeping problems, and an increased need to urinate.  During the third trimester your breasts will keep growing and they will continue to become tender. A yellow fluid (colostrum) may leak from your breasts. This is the first milk you are producing for your baby.  False labor is a condition in which you feel small, irregular tightenings of the muscles in the womb (contractions) that eventually go away. These are called Braxton Hicks contractions. Contractions may last for hours, days, or even weeks before true labor sets in.  Signs of labor can include: abdominal cramps; regular contractions that start at 10 minutes apart and become stronger and more frequent with time; watery or bloody mucus discharge that comes from the vagina; increased pelvic pressure and dull back pain; and leaking of amniotic fluid. This information is not intended to replace advice given to you by your  health care provider. Make sure you discuss any questions you have with your health care provider. Document Revised: 01/24/2019 Document Reviewed: 11/08/2016 Elsevier Patient Education  2020 [employer]  

## 2020-07-21 NOTE — Progress Notes (Signed)
   PRENATAL VISIT NOTE  Subjective:  Courtney Lyons is a 29 y.o. [id number] at [redacted]w[redacted]d being seen today for ongoing prenatal care.  She is currently monitored for the following issues for this high-risk pregnancy and has Obesity during pregnancy, antepartum; Gonorrhea of mother during pregnancy, first trimester; Iron deficiency anemia of pregnancy; Large for gestational age fetus affecting management of mother; Indication for care in labor and delivery, antepartum; Supervision of other normal pregnancy, antepartum; and Chronic hypertension in pregnancy on their problem list.  Patient reports no complaints.  Contractions: Irritability. Vag. Bleeding: None.  Movement: Present. Denies leaking of fluid.   The following portions of the patient's history were reviewed and updated as appropriate: allergies, current medications, past family history, past medical history, past social history, past surgical history and problem list.   Objective:   Vitals:   07/21/20 1109  BP: 130/87  Pulse: 84  Weight: 233 lb (105.7 kg)    Fetal Status: Fetal Heart Rate (bpm): 150   Movement: Present     General:  Alert, oriented and cooperative. Patient is in no acute distress.  Skin: Skin is warm and dry. No rash noted.   Cardiovascular: Normal heart rate noted  Respiratory: Normal respiratory effort, no problems with respiration noted  Abdomen: Soft, gravid, appropriate for gestational age.  Pain/Pressure: Absent     Pelvic: Cervical exam deferred        Extremities: Normal range of motion.     Mental Status: Normal mood and affect. Normal behavior. Normal judgment and thought content.   Assessment and Plan:  Pregnancy: [id number] at [redacted]w[redacted]d 1. Chronic hypertension in obstetric context in second trimester --Elevated BP at home and in MAU 9/19, has resolved and normotensive at home and in office today without meds. --Will continue to monitor --No s/sx of PEC  2. Supervision of high risk pregnancy in second  trimester --Anticipatory guidance about next visits/weeks of pregnancy given. --Next visit in 2 weeks, in office for GTT  Preterm labor symptoms and general obstetric precautions including but not limited to vaginal bleeding, contractions, leaking of fluid and fetal movement were reviewed in detail with the patient. Please refer to After Visit Summary for other counseling recommendations.   No follow-ups on file.  Future Appointments  Date Time Provider Department Center  08/10/2020 11:00 AM [facility] NURSE [facility]  08/10/2020 11:15 AM [facility]    Courtney Lyons, CNM

## 2020-07-27 ENCOUNTER — Other Ambulatory Visit: Payer: Self-pay

## 2020-07-27 ENCOUNTER — Encounter (HOSPITAL_COMMUNITY): Payer: Self-pay | Admitting: Emergency Medicine

## 2020-07-27 ENCOUNTER — Ambulatory Visit (HOSPITAL_COMMUNITY)
Admission: EM | Admit: 2020-07-27 | Discharge: 2020-07-27 | Disposition: A | Discharge status: Home / Self Care | Payer: Medicaid Other | Attending: Family Medicine | Admitting: Family Medicine

## 2020-07-27 DIAGNOSIS — K0889 Other specified disorders of teeth and supporting structures: Secondary | ICD-10-CM

## 2020-07-27 MED ORDER — AMOXICILLIN-POT CLAVULANATE 875-125 MG PO TABS: 1.0000 | ORAL_TABLET | Freq: Two times a day (BID) | ORAL | 0 refills | Status: DC

## 2020-07-27 NOTE — ED Provider Notes (Signed)
[facility]   [id number] 07/27/20 Arrival Time: 2751  ASSESSMENT & PLAN:  1. Pain, dental     No sign of abscess requiring I&D at this time. Discussed.  Meds ordered this encounter  Medications  . amoxicillin-clavulanate (AUGMENTIN) 875-125 MG tablet    Sig: Take 1 tablet by mouth every 12 (twelve) hours.    Dispense:  20 tablet    Refill:  0    Dental resource written instructions given. She will schedule dental evaluation as soon as possible if not improving over the next 24-48 hours.  Reviewed expectations re: course of current medical issues. Questions answered. Outlined signs and symptoms indicating need for more acute intervention. Patient verbalized understanding. After Visit Summary given.   SUBJECTIVE:  Courtney Lyons is a 29 y.o. female who reports gradual onset of right lower dental pain described as aching/throbbing. Present for 2 days. Fever: absent. Tolerating PO intake but reports pain with chewing. Normal swallowing. She does not see a dentist regularly. No neck swelling or pain. OTC analgesics without relief.  Patient's last menstrual period was 01/25/2020. Is pregnant.   OBJECTIVE: Vitals:   07/27/20 0939  BP: (!) 148/90  Pulse: 79  Resp: 16  Temp: 98.2 F (36.8 C)  TempSrc: Oral  SpO2: 100%    General appearance: alert; no distress HENT: normocephalic; atraumatic; dentition: poor; left lower rear gums without areas of fluctuance, drainage, or bleeding and with tenderness; normal jaw movement without difficulty Neck: supple without LAD; FROM; trachea midline Lungs: normal respirations; unlabored; speaks full sentences without difficulty Skin: warm and dry Psychological: alert and cooperative; normal mood and affect  No Known Allergies  Past Medical History:  Diagnosis Date  . Anemia   . Pregnancy induced hypertension    Social History   Socioeconomic History  . Marital status: Single    Spouse name: Not on file  . Number of  children: 2  . Years of education: Not on file  . Highest education level: Not on file  Occupational History  . Occupation: unemployed  Tobacco Use  . Smoking status: Never Smoker  . Smokeless tobacco: Never Used  Vaping Use  . Vaping Use: Never used  Substance and Sexual Activity  . Alcohol use: Not Currently  . Drug use: Not Currently    Types: Marijuana    Comment: stopped Aug/2019  . Sexual activity: Yes    Birth control/protection: None  Other Topics Concern  . Not on file  Social History Narrative  . Not on file   Social Determinants of Health   Financial Resource Strain:   . Difficulty of Paying Living Expenses: Not on file  Food Insecurity:   . Worried About Programme researcher, broadcasting/film/video in the Last Year: Not on file  . Ran Out of Food in the Last Year: Not on file  Transportation Needs:   . Lack of Transportation (Medical): Not on file  . Lack of Transportation (Non-Medical): Not on file  Physical Activity:   . Days of Exercise per Week: Not on file  . Minutes of Exercise per Session: Not on file  Stress:   . Feeling of Stress : Not on file  Social Connections:   . Frequency of Communication with Friends and Family: Not on file  . Frequency of Social Gatherings with Friends and Family: Not on file  . Attends Religious Services: Not on file  . Active Member of Clubs or Organizations: Not on file  . Attends Banker Meetings: Not  on file  . Marital Status: Not on file  Intimate Partner Violence:   . Fear of Current or Ex-Partner: Not on file  . Emotionally Abused: Not on file  . Physically Abused: Not on file  . Sexually Abused: Not on file   Family History  Problem Relation Age of Onset  . Hypertension Mother   . Stroke Mother   . Diabetes Father   . Birth defects Brother   . Asthma Daughter   . Cancer Paternal Aunt   . Early death Paternal Aunt   . Vision loss Paternal Grandfather    History reviewed. No pertinent surgical history.   Mardella Layman, MD 07/27/20 1017

## 2020-07-27 NOTE — ED Triage Notes (Signed)
Pt presents with sharp pain in jaw and swelling. States has a tooth that needs to be pulled on the side where swelling is occurring.

## 2020-08-04 ENCOUNTER — Ambulatory Visit (INDEPENDENT_AMBULATORY_CARE_PROVIDER_SITE_OTHER): Payer: Medicaid Other | Admitting: Advanced Practice Midwife

## 2020-08-04 ENCOUNTER — Other Ambulatory Visit: Payer: Self-pay

## 2020-08-04 ENCOUNTER — Other Ambulatory Visit: Payer: Medicaid Other

## 2020-08-04 VITALS — BP 137/96 | HR 66 | Wt 233.8 lb

## 2020-08-04 DIAGNOSIS — O0992 Supervision of high risk pregnancy, unspecified, second trimester: Secondary | ICD-10-CM

## 2020-08-04 DIAGNOSIS — D509 Iron deficiency anemia, unspecified: Secondary | ICD-10-CM

## 2020-08-04 DIAGNOSIS — O99019 Anemia complicating pregnancy, unspecified trimester: Secondary | ICD-10-CM

## 2020-08-04 DIAGNOSIS — O10912 Unspecified pre-existing hypertension complicating pregnancy, second trimester: Secondary | ICD-10-CM

## 2020-08-04 DIAGNOSIS — Z3A27 27 weeks gestation of pregnancy: Secondary | ICD-10-CM

## 2020-08-04 NOTE — Progress Notes (Signed)
   PRENATAL VISIT NOTE  Subjective:  Courtney Lyons is a 29 y.o. [id number] at [redacted]w[redacted]d being seen today for ongoing prenatal care.  She is currently monitored for the following issues for this low-risk pregnancy and has Obesity during pregnancy, antepartum; Gonorrhea of mother during pregnancy, first trimester; Iron deficiency anemia of pregnancy; Large for gestational age fetus affecting management of mother; Indication for care in labor and delivery, antepartum; Supervision of other normal pregnancy, antepartum; and Chronic hypertension in pregnancy on their problem list.  Patient reports no complaints.  Contractions: Not present. Vag. Bleeding: None.  Movement: Present. Denies leaking of fluid.   The following portions of the patient's history were reviewed and updated as appropriate: allergies, current medications, past family history, past medical history, past social history, past surgical history and problem list.   Objective:   Vitals:   08/04/20 1126  BP: (!) 137/96  Pulse: 66  Weight: 233 lb 12.8 oz (106.1 kg)    Fetal Status: Fetal Heart Rate (bpm): 145 Fundal Height: 27 cm Movement: Present     General:  Alert, oriented and cooperative. Patient is in no acute distress.  Skin: Skin is warm and dry. No rash noted.   Cardiovascular: Normal heart rate noted  Respiratory: Normal respiratory effort, no problems with respiration noted  Abdomen: Soft, gravid, appropriate for gestational age.  Pain/Pressure: Absent     Pelvic: Cervical exam deferred        Extremities: Normal range of motion.  Edema: Trace  Mental Status: Normal mood and affect. Normal behavior. Normal judgment and thought content.   Assessment and Plan:  Pregnancy: [id number] at [redacted]w[redacted]d 1. Chronic hypertension in obstetric context in second trimester --BP 137/96 today, no s/sx of PEC --Pt to take weekly BP at home and enter into Bab[employer], having trouble with [employer] account so may need to call office with BP.  Will have RN troubleshoot issue with app.    2. Supervision of high risk pregnancy in second trimester --Anticipatory guidance about next visits/weeks of pregnancy given. --Next visit in office in 4 weeks  3. [redacted] weeks gestation of pregnancy --GTT today  Preterm labor symptoms and general obstetric precautions including but not limited to vaginal bleeding, contractions, leaking of fluid and fetal movement were reviewed in detail with the patient. Please refer to After Visit Summary for other counseling recommendations.   No follow-ups on file.  Future Appointments  Date Time Provider Department Center  08/10/2020 11:00 AM [facility] [place] NURSE [facility]  08/10/2020 11:15 AM [facility]  09/01/2020 10:35 AM Leftwich-Kirby, Wilmer Floor, CNM [facility] None    Sharen Counter, CNM

## 2020-08-04 NOTE — Addendum Note (Signed)
Addended by: Eldred Manges on: 08/04/2020 01:09 PM   Modules accepted: Orders

## 2020-08-04 NOTE — Patient Instructions (Signed)
Third Trimester of Pregnancy The third trimester is from week 28 through week 40 (months 7 through 9). The third trimester is a time when the unborn baby (fetus) is growing rapidly. At the end of the ninth month, the fetus is about 20 inches in length and weighs 6-10 pounds. Body changes during your third trimester Your body will continue to go through many changes during pregnancy. The changes vary from woman to woman. During the third trimester:  Your weight will continue to increase. You can expect to gain 25-35 pounds (11-16 kg) by the end of the pregnancy.  You may begin to get stretch marks on your hips, abdomen, and breasts.  You may urinate more often because the fetus is moving lower into your pelvis and pressing on your bladder.  You may develop or continue to have heartburn. This is caused by increased hormones that slow down muscles in the digestive tract.  You may develop or continue to have constipation because increased hormones slow digestion and cause the muscles that push waste through your intestines to relax.  You may develop hemorrhoids. These are swollen veins (varicose veins) in the rectum that can itch or be painful.  You may develop swollen, bulging veins (varicose veins) in your legs.  You may have increased body aches in the pelvis, back, or thighs. This is due to weight gain and increased hormones that are relaxing your joints.  You may have changes in your hair. These can include thickening of your hair, rapid growth, and changes in texture. Some women also have hair loss during or after pregnancy, or hair that feels dry or thin. Your hair will most likely return to normal after your baby is born.  Your breasts will continue to grow and they will continue to become tender. A yellow fluid (colostrum) may leak from your breasts. This is the first milk you are producing for your baby.  Your belly button may stick out.  You may notice more swelling in your hands,  face, or ankles.  You may have increased tingling or numbness in your hands, arms, and legs. The skin on your belly may also feel numb.  You may feel short of breath because of your expanding uterus.  You may have more problems sleeping. This can be caused by the size of your belly, increased need to urinate, and an increase in your body's metabolism.  You may notice the fetus "dropping," or moving lower in your abdomen (lightening).  You may have increased vaginal discharge.  You may notice your joints feel loose and you may have pain around your pelvic bone. What to expect at prenatal visits You will have prenatal exams every 2 weeks until week 36. Then you will have weekly prenatal exams. During a routine prenatal visit:  You will be weighed to make sure you and the baby are growing normally.  Your blood pressure will be taken.  Your abdomen will be measured to track your baby's growth.  The fetal heartbeat will be listened to.  Any test results from the previous visit will be discussed.  You may have a cervical check near your due date to see if your cervix has softened or thinned (effaced).  You will be tested for Group B streptococcus. This happens between 35 and 37 weeks. Your health care provider may ask you:  What your birth plan is.  How you are feeling.  If you are feeling the baby move.  If you have had any abnormal   symptoms, such as leaking fluid, bleeding, severe headaches, or abdominal cramping.  If you are using any tobacco products, including cigarettes, chewing tobacco, and electronic cigarettes.  If you have any questions. Other tests or screenings that may be performed during your third trimester include:  Blood tests that check for low iron levels (anemia).  Fetal testing to check the health, activity level, and growth of the fetus. Testing is done if you have certain medical conditions or if there are problems during the pregnancy.  Nonstress test  (NST). This test checks the health of your baby to make sure there are no signs of problems, such as the baby not getting enough oxygen. During this test, a belt is placed around your belly. The baby is made to move, and its heart rate is monitored during movement. What is false labor? False labor is a condition in which you feel small, irregular tightenings of the muscles in the womb (contractions) that usually go away with rest, changing position, or drinking water. These are called Braxton Hicks contractions. Contractions may last for hours, days, or even weeks before true labor sets in. If contractions come at regular intervals, become more frequent, increase in intensity, or become painful, you should see your health care provider. What are the signs of labor?  Abdominal cramps.  Regular contractions that start at 10 minutes apart and become stronger and more frequent with time.  Contractions that start on the top of the uterus and spread down to the lower abdomen and back.  Increased pelvic pressure and dull back pain.  A watery or bloody mucus discharge that comes from the vagina.  Leaking of amniotic fluid. This is also known as your "water breaking." It could be a slow trickle or a gush. Let your health care provider know if it has a color or strange odor. If you have any of these signs, call your health care provider right away, even if it is before your due date. Follow these instructions at home: Medicines  Follow your health care provider's instructions regarding medicine use. Specific medicines may be either safe or unsafe to take during pregnancy.  Take a prenatal vitamin that contains at least 600 micrograms (mcg) of folic acid.  If you develop constipation, try taking a stool softener if your health care provider approves. Eating and drinking   Eat a balanced diet that includes fresh fruits and vegetables, whole grains, good sources of protein such as meat, eggs, or tofu,  and low-fat dairy. Your health care provider will help you determine the amount of weight gain that is right for you.  Avoid raw meat and uncooked cheese. These carry germs that can cause birth defects in the baby.  If you have low calcium intake from food, talk to your health care provider about whether you should take a daily calcium supplement.  Eat four or five small meals rather than three large meals a day.  Limit foods that are high in fat and processed sugars, such as fried and sweet foods.  To prevent constipation: ? Drink enough fluid to keep your urine clear or pale yellow. ? Eat foods that are high in fiber, such as fresh fruits and vegetables, whole grains, and beans. Activity  Exercise only as directed by your health care provider. Most women can continue their usual exercise routine during pregnancy. Try to exercise for 30 minutes at least 5 days a week. Stop exercising if you experience uterine contractions.  Avoid heavy lifting.  Do   not exercise in extreme heat or humidity, or at high altitudes.  Wear low-heel, comfortable shoes.  Practice good posture.  You may continue to have sex unless your health care provider tells you otherwise. Relieving pain and discomfort  Take frequent breaks and rest with your legs elevated if you have leg cramps or low back pain.  Take warm sitz baths to soothe any pain or discomfort caused by hemorrhoids. Use hemorrhoid cream if your health care provider approves.  Wear a good support bra to prevent discomfort from breast tenderness.  If you develop varicose veins: ? Wear support pantyhose or compression stockings as told by your healthcare provider. ? Elevate your feet for 15 minutes, 3-4 times a day. Prenatal care  Write down your questions. Take them to your prenatal visits.  Keep all your prenatal visits as told by your health care provider. This is important. Safety  Wear your seat belt at all times when driving.  Make  a list of emergency phone numbers, including numbers for family, friends, the hospital, and police and fire departments. General instructions  Avoid cat litter boxes and soil used by cats. These carry germs that can cause birth defects in the baby. If you have a cat, ask someone to clean the litter box for you.  Do not travel far distances unless it is absolutely necessary and only with the approval of your health care provider.  Do not use hot tubs, steam rooms, or saunas.  Do not drink alcohol.  Do not use any products that contain nicotine or tobacco, such as cigarettes and e-cigarettes. If you need help quitting, ask your health care provider.  Do not use any medicinal herbs or unprescribed drugs. These chemicals affect the formation and growth of the baby.  Do not douche or use tampons or scented sanitary pads.  Do not cross your legs for long periods of time.  To prepare for the arrival of your baby: ? Take prenatal classes to understand, practice, and ask questions about labor and delivery. ? Make a trial run to the hospital. ? Visit the hospital and tour the maternity area. ? Arrange for maternity or paternity leave through employers. ? Arrange for family and friends to take care of pets while you are in the hospital. ? Purchase a rear-facing car seat and make sure you know how to install it in your car. ? Pack your hospital bag. ? Prepare the baby's nursery. Make sure to remove all pillows and stuffed animals from the baby's crib to prevent suffocation.  Visit your dentist if you have not gone during your pregnancy. Use a soft toothbrush to brush your teeth and be gentle when you floss. Contact a health care provider if:  You are unsure if you are in labor or if your water has broken.  You become dizzy.  You have mild pelvic cramps, pelvic pressure, or nagging pain in your abdominal area.  You have lower back pain.  You have persistent nausea, vomiting, or  diarrhea.  You have an unusual or bad smelling vaginal discharge.  You have pain when you urinate. Get help right away if:  Your water breaks before 37 weeks.  You have regular contractions less than 5 minutes apart before 37 weeks.  You have a fever.  You are leaking fluid from your vagina.  You have spotting or bleeding from your vagina.  You have severe abdominal pain or cramping.  You have rapid weight loss or weight gain.  You have   shortness of breath with chest pain.  You notice sudden or extreme swelling of your face, hands, ankles, feet, or legs.  Your baby makes fewer than 10 movements in 2 hours.  You have severe headaches that do not go away when you take medicine.  You have vision changes. Summary  The third trimester is from week 28 through week 40, months 7 through 9. The third trimester is a time when the unborn baby (fetus) is growing rapidly.  During the third trimester, your discomfort may increase as you and your baby continue to gain weight. You may have abdominal, leg, and back pain, sleeping problems, and an increased need to urinate.  During the third trimester your breasts will keep growing and they will continue to become tender. A yellow fluid (colostrum) may leak from your breasts. This is the first milk you are producing for your baby.  False labor is a condition in which you feel small, irregular tightenings of the muscles in the womb (contractions) that eventually go away. These are called Braxton Hicks contractions. Contractions may last for hours, days, or even weeks before true labor sets in.  Signs of labor can include: abdominal cramps; regular contractions that start at 10 minutes apart and become stronger and more frequent with time; watery or bloody mucus discharge that comes from the vagina; increased pelvic pressure and dull back pain; and leaking of amniotic fluid. This information is not intended to replace advice given to you by your  health care provider. Make sure you discuss any questions you have with your health care provider. Document Revised: 01/24/2019 Document Reviewed: 11/08/2016 Elsevier Patient Education  2020 [employer]  

## 2020-08-05 LAB — GLUCOSE TOLERANCE, 2 HOURS W/ 1HR
Glucose, 1 hour: 132 mg/dL (ref 65–179)
Glucose, 2 hour: 99 mg/dL (ref 65–152)
Glucose, Fasting: 83 mg/dL (ref 65–91)

## 2020-08-05 LAB — HIV ANTIBODY (ROUTINE TESTING W REFLEX): HIV Screen 4th Generation wRfx: NONREACTIVE

## 2020-08-05 LAB — CBC
Hematocrit: 28.1 % — ABNORMAL LOW (ref 34.0–46.6)
Hemoglobin: 9 g/dL — ABNORMAL LOW (ref 11.1–15.9)
MCH: 25.2 pg — ABNORMAL LOW (ref 26.6–33.0)
MCHC: 32 g/dL (ref 31.5–35.7)
MCV: 79 fL (ref 79–97)
Platelets: 332 10*3/uL (ref 150–450)
RBC: 3.57 x10E6/uL — ABNORMAL LOW (ref 3.77–5.28)
RDW: 14.8 % (ref 11.7–15.4)
WBC: 7.8 10*3/uL (ref 3.4–10.8)

## 2020-08-05 LAB — RPR: RPR Ser Ql: NONREACTIVE

## 2020-08-08 MED ORDER — FERROUS SULFATE 325 (65 FE) MG PO TABS: 325.0000 mg | ORAL_TABLET | ORAL | 5 refills | Status: DC

## 2020-08-08 NOTE — Addendum Note (Signed)
Addended by: Sharen Counter A on: 08/08/2020 03:26 AM   Modules accepted: Orders

## 2020-08-10 ENCOUNTER — Ambulatory Visit: Payer: Medicaid Other | Admitting: *Deleted

## 2020-08-10 ENCOUNTER — Encounter: Payer: Self-pay | Admitting: *Deleted

## 2020-08-10 ENCOUNTER — Ambulatory Visit: Payer: Medicaid Other | Attending: Obstetrics and Gynecology

## 2020-08-10 ENCOUNTER — Other Ambulatory Visit: Payer: Self-pay

## 2020-08-10 DIAGNOSIS — Z3A28 28 weeks gestation of pregnancy: Secondary | ICD-10-CM

## 2020-08-10 DIAGNOSIS — O10913 Unspecified pre-existing hypertension complicating pregnancy, third trimester: Secondary | ICD-10-CM

## 2020-08-10 DIAGNOSIS — O10919 Unspecified pre-existing hypertension complicating pregnancy, unspecified trimester: Secondary | ICD-10-CM

## 2020-08-10 DIAGNOSIS — O99213 Obesity complicating pregnancy, third trimester: Secondary | ICD-10-CM | POA: Diagnosis not present

## 2020-08-10 DIAGNOSIS — Z362 Encounter for other antenatal screening follow-up: Secondary | ICD-10-CM | POA: Diagnosis not present

## 2020-08-10 DIAGNOSIS — Z3480 Encounter for supervision of other normal pregnancy, unspecified trimester: Secondary | ICD-10-CM | POA: Insufficient documentation

## 2020-08-10 DIAGNOSIS — Z148 Genetic carrier of other disease: Secondary | ICD-10-CM

## 2020-08-10 DIAGNOSIS — E669 Obesity, unspecified: Secondary | ICD-10-CM | POA: Diagnosis not present

## 2020-08-11 ENCOUNTER — Encounter (HOSPITAL_COMMUNITY): Payer: Self-pay | Admitting: Obstetrics & Gynecology

## 2020-08-11 ENCOUNTER — Other Ambulatory Visit: Payer: Self-pay | Admitting: *Deleted

## 2020-08-11 ENCOUNTER — Inpatient Hospital Stay (HOSPITAL_COMMUNITY)
Admission: AD | Admit: 2020-08-11 | Discharge: 2020-08-12 | Disposition: A | Discharge status: Home / Self Care | Payer: Medicaid Other | Attending: Obstetrics & Gynecology | Admitting: Obstetrics & Gynecology

## 2020-08-11 ENCOUNTER — Other Ambulatory Visit: Payer: Self-pay

## 2020-08-11 ENCOUNTER — Encounter (HOSPITAL_COMMUNITY): Payer: Self-pay

## 2020-08-11 ENCOUNTER — Ambulatory Visit (HOSPITAL_COMMUNITY)
Admission: EM | Admit: 2020-08-11 | Discharge: 2020-08-11 | Disposition: A | Discharge status: Home / Self Care | Payer: Medicaid Other | Attending: Emergency Medicine | Admitting: Emergency Medicine

## 2020-08-11 DIAGNOSIS — K0889 Other specified disorders of teeth and supporting structures: Secondary | ICD-10-CM

## 2020-08-11 DIAGNOSIS — O10913 Unspecified pre-existing hypertension complicating pregnancy, third trimester: Secondary | ICD-10-CM | POA: Diagnosis not present

## 2020-08-11 DIAGNOSIS — O99613 Diseases of the digestive system complicating pregnancy, third trimester: Secondary | ICD-10-CM | POA: Diagnosis not present

## 2020-08-11 DIAGNOSIS — Z3480 Encounter for supervision of other normal pregnancy, unspecified trimester: Secondary | ICD-10-CM

## 2020-08-11 DIAGNOSIS — R220 Localized swelling, mass and lump, head: Secondary | ICD-10-CM

## 2020-08-11 DIAGNOSIS — Z3A28 28 weeks gestation of pregnancy: Secondary | ICD-10-CM | POA: Diagnosis not present

## 2020-08-11 DIAGNOSIS — O365930 Maternal care for other known or suspected poor fetal growth, third trimester, not applicable or unspecified: Secondary | ICD-10-CM

## 2020-08-11 DIAGNOSIS — R030 Elevated blood-pressure reading, without diagnosis of hypertension: Secondary | ICD-10-CM

## 2020-08-11 DIAGNOSIS — O10919 Unspecified pre-existing hypertension complicating pregnancy, unspecified trimester: Secondary | ICD-10-CM

## 2020-08-11 LAB — COMPREHENSIVE METABOLIC PANEL
ALT: 14 U/L (ref 0–44)
AST: 14 U/L — ABNORMAL LOW (ref 15–41)
Albumin: 2.8 g/dL — ABNORMAL LOW (ref 3.5–5.0)
Alkaline Phosphatase: 67 U/L (ref 38–126)
Anion gap: 10 (ref 5–15)
BUN: 5 mg/dL — ABNORMAL LOW (ref 6–20)
CO2: 23 mmol/L (ref 22–32)
Calcium: 8.6 mg/dL — ABNORMAL LOW (ref 8.9–10.3)
Chloride: 104 mmol/L (ref 98–111)
Creatinine, Ser: 0.55 mg/dL (ref 0.44–1.00)
GFR, Estimated: >60 mL/min (ref >60)
Glucose, Bld: 89 mg/dL (ref 70–99)
Potassium: 3.1 mmol/L — ABNORMAL LOW (ref 3.5–5.1)
Sodium: 137 mmol/L (ref 135–145)
Total Bilirubin: 0.4 mg/dL (ref 0.3–1.2)
Total Protein: 6.8 g/dL (ref 6.5–8.1)

## 2020-08-11 LAB — URINALYSIS, ROUTINE W REFLEX MICROSCOPIC
Bacteria, UA: NONE SEEN
Bilirubin Urine: NEGATIVE
Glucose, UA: NEGATIVE mg/dL
Hgb urine dipstick: NEGATIVE
Ketones, ur: 80 mg/dL — AB
Leukocytes,Ua: NEGATIVE
Nitrite: NEGATIVE
Protein, ur: 30 mg/dL — AB
Specific Gravity, Urine: 1.026 (ref 1.005–1.030)
pH: 6 (ref 5.0–8.0)

## 2020-08-11 LAB — CBC
HCT: 27.9 % — ABNORMAL LOW (ref 36.0–46.0)
Hemoglobin: 8.9 g/dL — ABNORMAL LOW (ref 12.0–15.0)
MCH: 24.9 pg — ABNORMAL LOW (ref 26.0–34.0)
MCHC: 31.9 g/dL (ref 30.0–36.0)
MCV: 77.9 fL — ABNORMAL LOW (ref 80.0–100.0)
Platelets: 306 10*3/uL (ref 150–400)
RBC: 3.58 MIL/uL — ABNORMAL LOW (ref 3.87–5.11)
RDW: 15.7 % — ABNORMAL HIGH (ref 11.5–15.5)
WBC: 9.3 10*3/uL (ref 4.0–10.5)
nRBC: 0 % (ref 0.0–0.2)

## 2020-08-11 LAB — PROTEIN / CREATININE RATIO, URINE
Creatinine, Urine: 298.22 mg/dL
Protein Creatinine Ratio: 0.07 mg/mg{Cre} (ref 0.00–0.15)
Total Protein, Urine: 22 mg/dL

## 2020-08-11 MED ORDER — OXYCODONE-ACETAMINOPHEN 5-325 MG PO TABS
2.0000 | ORAL_TABLET | Freq: Once | ORAL | Status: AC
  Administered 2020-08-11: 2 via ORAL
  Filled 2020-08-11: qty 2

## 2020-08-11 MED ORDER — OXYCODONE-ACETAMINOPHEN 5-325 MG PO TABS
1.0000 | ORAL_TABLET | Freq: Four times a day (QID) | ORAL | 0 refills | Status: DC | PRN
Start: 2020-08-11 — End: 2020-09-02

## 2020-08-11 NOTE — ED Provider Notes (Signed)
[facility]    CSN: [id number] Arrival date & time: 08/11/20  1732      History   Chief Complaint Chief Complaint  Patient presents with  . Dental Pain    HPI Courtney Lyons is a 29 y.o. female.   Patient is 7 months pregnant.  She presents today with right lower jaw swelling and tooth pain.  She has a decayed tooth on the right lower side that is causing her pain.  She denies fever, chills, difficulty swallowing, or other symptoms.  Treatment at home with Tylenol.  Patient was seen here on 07/27/2020; diagnosed with dental pain; treated with Augmentin.  Her medical history includes pregnancy-induced hypertension, anemia, obesity during pregnancy.  The history is provided by the patient.    Past Medical History:  Diagnosis Date  . Anemia   . Pregnancy induced hypertension     Patient Active Problem List   Diagnosis Date Noted  . Chronic hypertension in pregnancy 06/05/2020  . Supervision of other normal pregnancy, antepartum 04/09/2020  . Indication for care in labor and delivery, antepartum 02/18/2019  . Large for gestational age fetus affecting management of mother 12/13/2018  . Iron deficiency anemia of pregnancy 11/01/2018  . Obesity during pregnancy, antepartum 09/06/2018  . Gonorrhea of mother during pregnancy, first trimester 09/06/2018    History reviewed. No pertinent surgical history.  OB History    Gravida  18   Para  3   Term  3   Preterm      AB  14   Living  3     SAB      TAB  14   Ectopic      Multiple  0   Live Births  3            Home Medications    Prior to Admission medications   Medication Sig Start Date End Date Taking? Authorizing Provider  aspirin 81 MG chewable tablet Chew 1 tablet (81 mg total) by mouth daily. 04/28/20  Yes Shelly Bombard, MD  ferrous sulfate (FERROUSUL) 325 (65 FE) MG tablet Take 1 tablet (325 mg total) by mouth every other day. 08/08/20  Yes Leftwich-Kirby, Kathie Dike, CNM  Prenat-Fe  Poly-Methfol-FA-DHA (VITAFOL ULTRA) 29-0.6-0.4-200 MG CAPS Take 1 capsule by mouth daily before breakfast. 04/28/20  Yes Shelly Bombard, MD  amoxicillin-clavulanate (AUGMENTIN) 875-125 MG tablet Take 1 tablet by mouth every 12 (twelve) hours. Patient not taking: Reported on 08/10/2020 07/27/20   Vanessa Kick, MD  Blood Pressure Monitor KIT 1 Device by dose not apply Route once for 1 dose Take B/P weekly 02/18/19   Constant, Peggy, MD  fluticasone (FLONASE) 50 MCG/ACT nasal spray Place 2 sprays into both nostrils daily. 04/30/18   Tasia Catchings, Amy V, PA-C  ipratropium (ATROVENT) 0.06 % nasal spray Place 2 sprays into both nostrils 4 (four) times daily. Patient not taking: Reported on 04/28/2020 04/30/18   Ok Edwards, PA-C  Prenat-FeFum-FePo-FA-DHA w/o A (PROVIDA DHA) 16-16-1.25-110 MG CAPS Take 1 tablet by mouth daily. Patient not taking: Reported on 06/23/2020 04/09/20   Shelly Bombard, MD    Family History Family History  Problem Relation Age of Onset  . Hypertension Mother   . Stroke Mother   . Diabetes Father   . Birth defects Brother   . Asthma Daughter   . Cancer Paternal Aunt   . Early death Paternal 40   . Vision loss Paternal Grandfather     Social History Social History  Tobacco Use  . Smoking status: Never Smoker  . Smokeless tobacco: Never Used  Vaping Use  . Vaping Use: Never used  Substance Use Topics  . Alcohol use: Not Currently  . Drug use: Not Currently    Types: Marijuana    Comment: stopped Aug/2019     Allergies   Patient has no known allergies.   Review of Systems Review of Systems  Constitutional: Negative for chills and fever.  HENT: Positive for dental problem and facial swelling. Negative for ear pain and sore throat.   Eyes: Negative for pain and visual disturbance.  Respiratory: Negative for cough and shortness of breath.   Cardiovascular: Negative for chest pain and palpitations.  Gastrointestinal: Negative for abdominal pain and vomiting.    Genitourinary: Negative for dysuria and hematuria.  Musculoskeletal: Negative for arthralgias and back pain.  Skin: Negative for color change and rash.  Neurological: Negative for seizures, syncope, weakness and numbness.  All other systems reviewed and are negative.    Physical Exam Triage Vital Signs ED Triage Vitals [08/11/20 1947]  Enc Vitals Group     BP (!) 154/101     Pulse Rate 84     Resp 17     Temp 98.7 F (37.1 C)     Temp Source Oral     SpO2 100 %     Weight      Height      Head Circumference      Peak Flow      Pain Score      Pain Loc      Pain Edu?      Excl. in [place]?    No data found.  Updated Vital Signs BP (!) 153/100 (BP Location: Left Arm)   Pulse 84   Temp 98.7 F (37.1 C) (Oral)   Resp 17   LMP 01/25/2020   SpO2 100%   Visual Acuity Right Eye Distance:   Left Eye Distance:   Bilateral Distance:    Right Eye Near:   Left Eye Near:    Bilateral Near:     Physical Exam Vitals and nursing note reviewed.  Constitutional:      General: She is not in acute distress.    Appearance: She is well-developed.  HENT:     Head: Normocephalic and atraumatic.     Mouth/Throat:     Mouth: Mucous membranes are moist.      Comments: Right lower jaw swelling and tenderness.  Eyes:     Conjunctiva/sclera: Conjunctivae normal.  Cardiovascular:     Rate and Rhythm: Normal rate and regular rhythm.     Heart sounds: No murmur heard.   Pulmonary:     Effort: Pulmonary effort is normal. No respiratory distress.     Breath sounds: Normal breath sounds.  Abdominal:     Palpations: Abdomen is soft.     Tenderness: There is no abdominal tenderness.  Musculoskeletal:     Cervical back: Neck supple.  Skin:    General: Skin is warm and dry.  Neurological:     General: No focal deficit present.     Mental Status: She is alert and oriented to person, place, and time.     Sensory: No sensory deficit.     Motor: No weakness.     Gait: Gait normal.   Psychiatric:        Mood and Affect: Mood normal.        Behavior: Behavior normal.  UC Treatments / Results  Labs (all labs ordered are listed, but only abnormal results are displayed) Labs Reviewed - No data to display  EKG   Radiology [place] MFM OB FOLLOW UP  Result Date: 08/10/2020 ----------------------------------------------------------------------  OBSTETRICS REPORT                       (Signed Final 08/10/2020 02:01 pm) ---------------------------------------------------------------------- Patient Info  ID #:       [id number]                          D.O.B.:  01-17-1991 (29 yrs)  Name:       Courtney Lyons                    Visit Date: 08/10/2020 12:03 pm ---------------------------------------------------------------------- Performed By  Attending:        Johnell Comings MD         Ref. Address:     [address]                                                             [place]                                                             [place]  Performed By:     Claudia Desanctis,      Location[facility]         [facility]                                [facility]                                                             [place] [facility]  Referred By:      [facility] ---------------------------------------------------------------------- Orders  #  Description                           Code        Ordered By  1  [place] MFM OB FOLLOW UP                   [phone]    RAVI Donalee Citrin ----------------------------------------------------------------------  #  Order #                     Accession #                Episode #  1  [id number]                   [id number]                 [id number] ----------------------------------------------------------------------  Indications  Hypertension - Chronic/Pre-existing (No        O10.019  Meds)  Obesity complicating pregnancy, second         O99.212  trimester (BMI 39)  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 Cord Insertion:      Visualized, central  Amniotic Fluid  AFI FV:      Within normal limits  AFI Sum(cm)     %Tile       Largest Pocket(cm)  15.96           58          6.04  RUQ(cm)       RLQ(cm)       LUQ(cm)        LLQ(cm)  3.37          1.36          6.04           5.19 ---------------------------------------------------------------------- Biometry  BPD:      69.6  mm     G. Age:  28w 0d         28  %    CI:         78.6   %    70 - 86                                                          FL/HC:      20.0   %    18.8 - 20.6  HC:      248.3  mm     G. Age:  27w 0d          2  %    HC/AC:      1.08        1.05 - 1.21  AC:  229.1  mm     G. Age:  27w 2d         16  %    FL/BPD:     71.3   %    71 - 87  FL:       49.6  mm     G. Age:  26w 5d          5  %    FL/AC:      21.6   %    20 - 24  LV:          3  mm  Est. FW:    1027  gm      2 lb 4 oz      7  % ---------------------------------------------------------------------- OB History  Gravidity:    18        Term:   3  Living:       3 ---------------------------------------------------------------------- Gestational Age  LMP:           28w 2d        Date:  01/25/20                 EDD:   10/31/20  U/S Today:     27w 2d                                        EDD:   11/07/20  Best:          28w 2d     Det. By:  LMP  (01/25/20)          EDD:   10/31/20 ---------------------------------------------------------------------- Anatomy  Cranium:               Appears normal          Heart:                  Appears normal                                                                        (4CH, axis, and                                                                        situs)  Cavum:                 Appears normal         RVOT:                   Appears normal  Ventricles:            Appears normal         LVOT:                   Appears normal  Choroid Plexus:        Appears normal  Aortic Arch:            Appears normal  Cerebellum:            Appears normal         Stomach:                Appears normal, left                                                                        sided  Face:                  Appears normal         Kidneys:                Appear normal                         (orbits and profile)  Lips:                  Appears normal         Bladder:                Appears normal  Other:  Other anatomy previously imaged and appeared normal ---------------------------------------------------------------------- Doppler - Fetal Vessels  Umbilical Artery   S/D     %tile      RI    %tile                             ADFV    RDFV   3.11       56    0.68       61                                No      No ---------------------------------------------------------------------- Cervix Uterus Adnexa  Cervix  Not visualized (advanced GA >24wks) ---------------------------------------------------------------------- Comments  This patient was seen for a follow up growth scan due to a  history of chronic hypertension that is not currently treated  with any medications.  She denies any problems since her  last exam.  On today's exam, the EFW measures at the 7th percentile for  her gestational age indicating fetal growth restriction. There  was normal amniotic fluid noted.  The patient reports that  fetal growth restriction was noted in her 3 prior pregnancies.  Her three other children were all delivered close to term  weighing 6 pounds.  They are all doing well today and   therefore she is not that concerned with the estimated fetal  weight obtained today.  Doppler studies of the umbilical arteries showed a normal  S/D ratio of 3.11.  There were no signs of absent or reversed  end-diastolic flow.  Due to fetal growth restriction, an NST and umbilical artery  Doppler study was scheduled in 2 weeks.  We will reassess the fetal growth again in 3 weeks. ----------------------------------------------------------------------                   Johnell Comings, MD Electronically Signed Final Report   08/10/2020 02:01 pm ----------------------------------------------------------------------  Procedures Procedures (including critical care time)  Medications Ordered in UC Medications - No data to display  Initial Impression / Assessment and Plan / UC Course  I have reviewed the triage vital signs and the nursing notes.  Pertinent labs & imaging results that were available during my care of the patient were reviewed by me and considered in my medical decision making (see chart for details).   Dental pain, facial swelling, elevated blood pressure, 7 months pregnant. Blood pressure 153/101.  Sending patient to [facility] hospital for evaluation.     Final Clinical Impressions(s) / UC Diagnoses   Final diagnoses:  Pain, dental  Facial swelling  Elevated blood pressure reading     Discharge Instructions     Go to the [facility] hospital for evaluation of your elevated blood pressure during pregnancy, facial swelling, and dental pain.        ED Prescriptions    None     PDMP not reviewed this encounter.   Sharion Balloon, NP 08/11/20 2020

## 2020-08-11 NOTE — Discharge Instructions (Addendum)
Go to the [facility] hospital for evaluation of your elevated blood pressure during pregnancy, facial swelling, and dental pain.

## 2020-08-11 NOTE — ED Triage Notes (Signed)
Pt c/o dental pain lower right tooth area onset today. Pt states she had dental pain/swelling in same area approx 2 weeks ago and completed all of her antibiotics. Has not had a return phone call from dentist for eval and tx.   Denies fever, bad taste in mouth. Right mandibular area edematous, warm.

## 2020-08-11 NOTE — ED Notes (Signed)
Patient is being discharged from the Urgent Care and sent to the Emergency Department via POV Per Wendee Beavers, NP, patient is in need of higher level of care due to dental cellulitis, hypertension Patient is aware and verbalizes understanding of plan of care.  Vitals:   08/11/20 1950 08/11/20 2015  BP: (!) 153/101 (!) 153/100  Pulse:    Resp:    Temp:    SpO2:

## 2020-08-11 NOTE — MAU Provider Note (Signed)
Chief Complaint  Patient presents with  . Hypertension     First Provider Initiated Contact with Patient 08/11/20 2155      S: Loveah Like  is a 29 y.o. y.o. year old [id number] female at [redacted]w[redacted]d weeks gestation who presents to MAU with elevated blood pressures. Patient was seen at urgent care today for dental pain and jaw swelling. Patient was evaluated and discussed from urgent and told to also be evaluated at MAU due to elevated BPs. Patient reports that BP was 150s/100s at [facility]. Patient reports that elevated BP is due to her being in pain. Hx of chronic hypertension currently not on blood pressure medication.   She denies any associated symptoms of HA, vision changes or epigastric pain. Patient reports good fetal movement and denies vaginal bleeding or contractions. Patient rates dental pain 10/10 - has taken tylenol for pain without relief. Patient recently completed course of antibiotics for dental pain, patient reports that she has not been to dentist.   O:  Patient Vitals for the past 24 hrs:  BP Temp Temp src Pulse Resp SpO2 Height Weight  08/11/20 2331 128/78 -- -- 75 -- -- -- --  08/11/20 2316 (!) 141/85 -- -- 77 -- -- -- --  08/11/20 2301 (!) 138/93 -- -- 85 -- -- -- --  08/11/20 2248 129/81 -- -- 79 -- -- -- --  08/11/20 2231 (!) 145/95 -- -- 81 -- -- -- --  08/11/20 2216 (!) 151/109 -- -- 87 -- -- -- --  08/11/20 2201 (!) 155/96 -- -- 86 -- -- -- --  08/11/20 2146 (!) 149/85 -- -- 79 -- -- -- --  08/11/20 2132 (!) 149/99 -- -- 86 -- -- -- --  08/11/20 2114 (!) 149/91 98.3 F (36.8 C) Oral 79 18 100 % 5\' 4"  (1.626 m) 106.4 kg   General: NAD Heart: Regular rate Lungs: Normal rate and effort Abd: Soft, NT, Gravid, S=D Extremities: no Pedal edema Neuro: 2+ deep tendon reflexes, No clonus  Fetal monitoring:  140/moderate/+accels/ no decelerations  No UC   Treatment in MAU included percocet for dental pain. Pre E workup pending Reassessment @2250  after  medication given, patient reports relief of dental pain and most recent BP 129/81.   Patients normal range for BPs in the office 140s/80s. PEC labs today normal.  Consult with Dr @2310 , reviewed assessment and management. Okay with discharge without medication at this time. Recommends patient be initiated on medication if BP continues to be over 150s/100s.  Results for orders placed or performed during the hospital encounter of 08/11/20 (from the past 24 hour(s))  Protein / creatinine ratio, urine     Status: None   Collection Time: 08/11/20  9:55 PM  Result Value Ref Range   Creatinine, Urine 298.22 mg/dL   Total Protein, Urine 22 mg/dL   Protein Creatinine Ratio 0.07 0.00 - 0.15 mg/mg[Cre]  Urinalysis, Routine w reflex microscopic     Status: Abnormal   Collection Time: 08/11/20  9:55 PM  Result Value Ref Range   Color, Urine YELLOW YELLOW   APPearance CLEAR CLEAR   Specific Gravity, Urine 1.026 1.005 - 1.030   pH 6.0 5.0 - 8.0   Glucose, UA NEGATIVE NEGATIVE mg/dL   Hgb urine dipstick NEGATIVE NEGATIVE   Bilirubin Urine NEGATIVE NEGATIVE   Ketones, ur 80 (A) NEGATIVE mg/dL   Protein, ur 30 (A) NEGATIVE mg/dL   Nitrite NEGATIVE NEGATIVE   Leukocytes,Ua NEGATIVE NEGATIVE   RBC /  HPF 0-5 0 - 5 RBC/hpf   WBC, UA 0-5 0 - 5 WBC/hpf   Bacteria, UA NONE SEEN NONE SEEN   Squamous Epithelial / LPF 0-5 0 - 5   Mucus PRESENT   CBC     Status: Abnormal   Collection Time: 08/11/20 10:14 PM  Result Value Ref Range   WBC 9.3 4.0 - 10.5 K/uL   RBC 3.58 (L) 3.87 - 5.11 MIL/uL   Hemoglobin 8.9 (L) 12.0 - 15.0 g/dL   HCT 11.9 (L) 36 - 46 %   MCV 77.9 (L) 80.0 - 100.0 fL   MCH 24.9 (L) 26.0 - 34.0 pg   MCHC 31.9 30.0 - 36.0 g/dL   RDW 14.7 (H) 82.9 - 56.2 %   Platelets 306 150 - 400 K/uL   nRBC 0.0 0.0 - 0.2 %  Comprehensive metabolic panel     Status: Abnormal   Collection Time: 08/11/20 10:14 PM  Result Value Ref Range   Sodium 137 135 - 145 mmol/L   Potassium 3.1 (L) 3.5 -  5.1 mmol/L   Chloride 104 98 - 111 mmol/L   CO2 23 22 - 32 mmol/L   Glucose, Bld 89 70 - 99 mg/dL   BUN 5 (L) 6 - 20 mg/dL   Creatinine, Ser 1.30 0.44 - 1.00 mg/dL   Calcium 8.6 (L) 8.9 - 10.3 mg/dL   Total Protein 6.8 6.5 - 8.1 g/dL   Albumin 2.8 (L) 3.5 - 5.0 g/dL   AST 14 (L) 15 - 41 U/L   ALT 14 0 - 44 U/L   Alkaline Phosphatase 67 38 - 126 U/L   Total Bilirubin 0.4 0.3 - 1.2 mg/dL   GFR, Estimated >86 >57 mL/min   Anion gap 10 5 - 15   A: [redacted]w[redacted]d week IUP Chronic hypertension Dental pain- dental letter given to patient so that she can see dentist  FHR reactive  P: Discharge home in stable condition per consult with Dr Mayford Knife  Follow up as scheduled in the office and MFM  PEC precautions and reasons to return to MAU  Follow up with dentist for dental pain  Rx for short course of percocet sent to pharmacy   Sharyon Cable, CNM 08/11/2020 11:59 PM

## 2020-08-11 NOTE — MAU Note (Signed)
Pt states she went to urgent care for dental pain and facial swelling. Her b/p was elevated and they sent her over here.

## 2020-08-24 ENCOUNTER — Other Ambulatory Visit: Payer: Self-pay

## 2020-08-24 ENCOUNTER — Ambulatory Visit: Payer: Medicaid Other | Admitting: *Deleted

## 2020-08-24 ENCOUNTER — Ambulatory Visit: Payer: Medicaid Other | Attending: Obstetrics

## 2020-08-24 ENCOUNTER — Ambulatory Visit: Payer: Medicaid Other

## 2020-08-24 DIAGNOSIS — Z148 Genetic carrier of other disease: Secondary | ICD-10-CM

## 2020-08-24 DIAGNOSIS — Z3480 Encounter for supervision of other normal pregnancy, unspecified trimester: Secondary | ICD-10-CM | POA: Diagnosis present

## 2020-08-24 DIAGNOSIS — O99213 Obesity complicating pregnancy, third trimester: Secondary | ICD-10-CM

## 2020-08-24 DIAGNOSIS — Z364 Encounter for antenatal screening for fetal growth retardation: Secondary | ICD-10-CM

## 2020-08-24 DIAGNOSIS — O10913 Unspecified pre-existing hypertension complicating pregnancy, third trimester: Secondary | ICD-10-CM | POA: Diagnosis not present

## 2020-08-24 DIAGNOSIS — O10919 Unspecified pre-existing hypertension complicating pregnancy, unspecified trimester: Secondary | ICD-10-CM | POA: Insufficient documentation

## 2020-08-24 DIAGNOSIS — O09293 Supervision of pregnancy with other poor reproductive or obstetric history, third trimester: Secondary | ICD-10-CM

## 2020-08-24 DIAGNOSIS — O365930 Maternal care for other known or suspected poor fetal growth, third trimester, not applicable or unspecified: Secondary | ICD-10-CM

## 2020-08-24 DIAGNOSIS — E669 Obesity, unspecified: Secondary | ICD-10-CM

## 2020-08-24 DIAGNOSIS — Z3A30 30 weeks gestation of pregnancy: Secondary | ICD-10-CM

## 2020-08-31 ENCOUNTER — Ambulatory Visit (HOSPITAL_BASED_OUTPATIENT_CLINIC_OR_DEPARTMENT_OTHER): Payer: Medicaid Other | Admitting: *Deleted

## 2020-08-31 ENCOUNTER — Ambulatory Visit: Payer: Medicaid Other | Admitting: *Deleted

## 2020-08-31 ENCOUNTER — Other Ambulatory Visit: Payer: Self-pay | Admitting: *Deleted

## 2020-08-31 ENCOUNTER — Other Ambulatory Visit: Payer: Self-pay

## 2020-08-31 ENCOUNTER — Ambulatory Visit: Payer: Medicaid Other | Attending: Obstetrics

## 2020-08-31 ENCOUNTER — Encounter: Payer: Self-pay | Admitting: *Deleted

## 2020-08-31 DIAGNOSIS — Z3480 Encounter for supervision of other normal pregnancy, unspecified trimester: Secondary | ICD-10-CM | POA: Diagnosis present

## 2020-08-31 DIAGNOSIS — O10013 Pre-existing essential hypertension complicating pregnancy, third trimester: Secondary | ICD-10-CM

## 2020-08-31 DIAGNOSIS — O10919 Unspecified pre-existing hypertension complicating pregnancy, unspecified trimester: Secondary | ICD-10-CM

## 2020-08-31 DIAGNOSIS — O365931 Maternal care for other known or suspected poor fetal growth, third trimester, fetus 1: Secondary | ICD-10-CM | POA: Insufficient documentation

## 2020-08-31 DIAGNOSIS — E669 Obesity, unspecified: Secondary | ICD-10-CM

## 2020-08-31 DIAGNOSIS — Z3A31 31 weeks gestation of pregnancy: Secondary | ICD-10-CM

## 2020-08-31 DIAGNOSIS — O365930 Maternal care for other known or suspected poor fetal growth, third trimester, not applicable or unspecified: Secondary | ICD-10-CM | POA: Insufficient documentation

## 2020-08-31 DIAGNOSIS — O99213 Obesity complicating pregnancy, third trimester: Secondary | ICD-10-CM

## 2020-08-31 DIAGNOSIS — O09293 Supervision of pregnancy with other poor reproductive or obstetric history, third trimester: Secondary | ICD-10-CM | POA: Diagnosis not present

## 2020-08-31 DIAGNOSIS — Z148 Genetic carrier of other disease: Secondary | ICD-10-CM

## 2020-08-31 NOTE — Procedures (Signed)
Courtney Lyons October 03, 1991 [redacted]w[redacted]d  Fetus A Non-Stress Test Interpretation for 08/31/20  Indication: IUGR and Chronic Hypertenstion  Fetal Heart Rate A Mode: External Baseline Rate (A): 145 bpm Variability: Moderate Accelerations: 15 x 15 Decelerations: Prolonged (Tracing reactive, then FHR decel to low 100's X followed by reactive tracing.) Multiple birth?: No  Uterine Activity Mode: Palpation, Toco Contraction Frequency (min): None Resting Tone Palpated: Relaxed Resting Time: Adequate  Interpretation (Fetal Testing) Nonstress Test Interpretation: Reactive Comments: Dr. Parke Poisson reviewed tracing. Pt proceeded to U/S as previously scheduled.

## 2020-09-01 ENCOUNTER — Encounter: Payer: Medicaid Other | Admitting: Advanced Practice Midwife

## 2020-09-02 ENCOUNTER — Other Ambulatory Visit: Payer: Self-pay

## 2020-09-02 ENCOUNTER — Ambulatory Visit (INDEPENDENT_AMBULATORY_CARE_PROVIDER_SITE_OTHER): Payer: Medicaid Other | Admitting: Certified Nurse Midwife

## 2020-09-02 ENCOUNTER — Encounter: Payer: Self-pay | Admitting: Certified Nurse Midwife

## 2020-09-02 VITALS — BP 129/84 | HR 88 | Wt 237.0 lb

## 2020-09-02 DIAGNOSIS — O10919 Unspecified pre-existing hypertension complicating pregnancy, unspecified trimester: Secondary | ICD-10-CM

## 2020-09-02 DIAGNOSIS — O99210 Obesity complicating pregnancy, unspecified trimester: Secondary | ICD-10-CM

## 2020-09-02 DIAGNOSIS — Z3480 Encounter for supervision of other normal pregnancy, unspecified trimester: Secondary | ICD-10-CM

## 2020-09-02 DIAGNOSIS — O365990 Maternal care for other known or suspected poor fetal growth, unspecified trimester, not applicable or unspecified: Secondary | ICD-10-CM

## 2020-09-02 DIAGNOSIS — Z3A31 31 weeks gestation of pregnancy: Secondary | ICD-10-CM

## 2020-09-02 NOTE — Progress Notes (Signed)
   PRENATAL VISIT NOTE  Subjective:  Courtney Lyons is a 29 y.o. [id number] at [redacted]w[redacted]d being seen today for ongoing prenatal care.  She is currently monitored for the following issues for this high-risk pregnancy and has Obesity during pregnancy, antepartum; Supervision of other normal pregnancy, antepartum; Chronic hypertension in pregnancy; and Pregnancy affected by fetal growth restriction on their problem list.  Patient reports no complaints.  Contractions: Irritability. Vag. Bleeding: None.  Movement: Present. Denies leaking of fluid.   The following portions of the patient's history were reviewed and updated as appropriate: allergies, current medications, past family history, past medical history, past social history, past surgical history and problem list.   Objective:   Vitals:   09/02/20 1040  BP: 129/84  Pulse: 88  Weight: 237 lb (107.5 kg)    Fetal Status: Fetal Heart Rate (bpm): 145   Movement: Present     General:  Alert, oriented and cooperative. Patient is in no acute distress.  Skin: Skin is warm and dry. No rash noted.   Cardiovascular: Normal heart rate noted  Respiratory: Normal respiratory effort, no problems with respiration noted  Abdomen: Soft, gravid, appropriate for gestational age.  Pain/Pressure: Absent     Pelvic: Cervical exam deferred        Extremities: Normal range of motion.     Mental Status: Normal mood and affect. Normal behavior. Normal judgment and thought content.   Assessment and Plan:  Pregnancy: [id number] at [redacted]w[redacted]d 1. Supervision of other normal pregnancy, antepartum - Patient doing well no complaints or concerns at this time  - Patient reports feeling much better now that tooth is pulled  - Routine prenatal care - Anticipatory guidance on upcoming appointments   2. Chronic hypertension in pregnancy - BP stable at this time - no medication   3. Obesity during pregnancy, antepartum - continue bASA   4. Pregnancy affected by fetal growth  restriction -  Est. FW:    1465  gm      3 lb 4 oz      7  % - followed by MFM and weekly doppler and BPP scheduled   5. [redacted] weeks gestation of pregnancy  Preterm labor symptoms and general obstetric precautions including but not limited to vaginal bleeding, contractions, leaking of fluid and fetal movement were reviewed in detail with the patient. Please refer to After Visit Summary for other counseling recommendations.   Return in about 16 days (around 09/18/2020) for HROB, in person.  Future Appointments  Date Time Provider Department Center  09/07/2020 11:00 AM [facility] NURSE [facility]  09/07/2020 11:15 AM [facility] US2 [facility]  09/16/2020 10:45 AM [facility] NURSE [facility]  09/16/2020 11:00 AM [facility] US1 [facility]  09/18/2020 10:35 AM Sharyon Cable, CNM [facility] None  09/21/2020 10:15 AM [facility] NURSE [facility]  09/21/2020 10:30 AM [facility] US3 [facility]    Sharyon Cable, CNM

## 2020-09-02 NOTE — Progress Notes (Signed)
237

## 2020-09-02 NOTE — Progress Notes (Signed)
Pt has no complaints today.  

## 2020-09-02 NOTE — Patient Instructions (Signed)
Intrauterine Growth Restriction  Intrauterine growth restriction (IUGR) is when a baby is not growing normally during pregnancy. A baby with IUGR is smaller than it should be and may weigh less than normal at birth. IUGR can result from a problem with the organ that supplies the unborn baby (fetus) with oxygen and nutrition (placenta). Usually, there is no way to prevent this type of problem. Babies with IUGR are at higher risk for early delivery and needing special (intensive) care after birth. What are the causes? The most common cause of IUGR is a problem with the placenta or umbilical cord that causes the fetus to get less oxygen or nutrition than needed. Other causes include:  The mother eating a very unhealthy diet (poor maternal nutrition).  Exposure to chemicals found in substances such as cigarettes, alcohol, and some drugs.  Some prescription medicines.  Other problems that develop in the womb (congenital birth defects).  Genetic disorders.  Infection.  Carrying more than one baby. What increases the risk? This condition is more likely to affect babies of mothers who:  Are over the age of 35 at the time of delivery.  Are younger than age 16 at the time of delivery.  Have medical conditions such as high blood pressure, preeclampsia, diabetes, heart or kidney disease, systemic lupus erythematosus, or anemia.  Live at a very high altitude during pregnancy.  Have a personal history or family history of: ? IUGR. ? A genetic disorder.  Take medicines during pregnancy that are related to congenital disabilities.  Come into contact with infected cat feces (toxoplasmosis).  Come into contact with chickenpox (varicella) or German measles (rubella).  Have or are at risk of getting an infectious disease such as syphilis, HIV, or herpes.  Eat an unhealthy diet during pregnancy.  Weigh less than 100 pounds.  Have had treatments to help her have children (infertility  treatments).  Use tobacco, drugs, or alcohol during pregnancy. What are the signs or symptoms? IUGR does not cause many symptoms. You might notice that your baby does not move or kick very often. Also, your belly may not be as big as expected for the stage of your pregnancy. How is this diagnosed? This condition is diagnosed with physical exams and prenatal exams. You may also have:  Fundal measurements to check the size of your uterus.  An ultrasound done to measure your baby's size compared to the size of other babies at the same stage of development (gestational age). Your health care provider will monitor your baby's growth with ultrasounds throughout pregnancy. You may also have tests to find the cause of IUGR. These may include:  Amniocentesis. This is a procedure that involves passing a needle into the uterus to collect a sample of fluid that surrounds the fetus (amniotic fluid). This may be done to check for signs of infection or congenital defects.  Tests to evaluate blood flow to your baby and placenta. How is this treated? In most cases, the goal of treatment is to treat the cause of IUGR. Your health care providers will monitor your pregnancy closely and help you manage your pregnancy. If your condition is caused by a placenta problem and your baby is not getting enough blood, you may need:  Medicine to start labor and deliver your baby early (induction).  Cesarean delivery, also called a C-section. In this procedure, your baby is delivered through an incision in your abdomen and uterus. Follow these instructions at home: Medicines  Take over-the-counter and prescription medicines   only as told by your health care provider. This includes vitamins and supplements.  Make sure that your health care provider knows about and approves of all the medicines, supplements, vitamins, eye drops, and creams that you use. General instructions  Eat a healthy diet that includes fresh fruits  and vegetables, lean proteins, whole grains, and calcium-rich foods such as milk, yogurt, and dark, leafy greens. Work with your health care provider or a dietitian to make sure that: ? You are getting enough nutrients. ? You are gaining enough weight.  Rest as needed. Try to get at least 8 hours of sleep every night.  Do not drink alcohol or use drugs.  Do not use any products that contain nicotine or tobacco, such as cigarettes and e-cigarettes. If you need help quitting, ask your health care provider.  Keep all follow-up visits as told by your health care provider. This is important. Get help right away if you:  Notice that your baby is moving less than usual or is not moving.  Have contractions that are 5 minutes or less apart, or that increase in frequency, intensity, or length.  Have signs and symptoms of infection, including a fever.  Have vaginal bleeding.  Have increased swelling in your legs, hands, or face.  Have vision changes, including seeing spots or having blurry or double vision.  Have a severe headache that does not go away.  Have sudden, sharp abdominal pain or low back pain.  Have an uncontrolled gush or trickle of fluid from your vagina. Summary  Intrauterine growth restriction (IUGR) is when a baby is not growing normally during pregnancy.  The most common cause of IUGR is a problem with the placenta or umbilical cord that causes the fetus to get less oxygen or nutrition than needed.  This condition is diagnosed with physical and prenatal exams. Your health care provider will monitor your baby's growth with ultrasounds throughout pregnancy.  Make sure that your health care provider knows about and approves of all the medicines, supplements, vitamins, eye drops, and creams that you use. This information is not intended to replace advice given to you by your health care provider. Make sure you discuss any questions you have with your health care  provider. Document Revised: 09/15/2017 Document Reviewed: 08/03/2017 Elsevier Patient Education  2020 [employer] Inc.  

## 2020-09-07 ENCOUNTER — Encounter: Payer: Self-pay | Admitting: *Deleted

## 2020-09-07 ENCOUNTER — Ambulatory Visit: Payer: Medicaid Other | Admitting: *Deleted

## 2020-09-07 ENCOUNTER — Ambulatory Visit: Payer: Medicaid Other | Attending: Obstetrics

## 2020-09-07 ENCOUNTER — Other Ambulatory Visit: Payer: Self-pay

## 2020-09-07 DIAGNOSIS — O10919 Unspecified pre-existing hypertension complicating pregnancy, unspecified trimester: Secondary | ICD-10-CM | POA: Insufficient documentation

## 2020-09-07 DIAGNOSIS — O365990 Maternal care for other known or suspected poor fetal growth, unspecified trimester, not applicable or unspecified: Secondary | ICD-10-CM | POA: Diagnosis present

## 2020-09-07 DIAGNOSIS — O10913 Unspecified pre-existing hypertension complicating pregnancy, third trimester: Secondary | ICD-10-CM

## 2020-09-07 DIAGNOSIS — Z3A32 32 weeks gestation of pregnancy: Secondary | ICD-10-CM

## 2020-09-07 DIAGNOSIS — O99213 Obesity complicating pregnancy, third trimester: Secondary | ICD-10-CM

## 2020-09-07 DIAGNOSIS — O09293 Supervision of pregnancy with other poor reproductive or obstetric history, third trimester: Secondary | ICD-10-CM | POA: Diagnosis not present

## 2020-09-07 DIAGNOSIS — Z148 Genetic carrier of other disease: Secondary | ICD-10-CM

## 2020-09-07 DIAGNOSIS — O365930 Maternal care for other known or suspected poor fetal growth, third trimester, not applicable or unspecified: Secondary | ICD-10-CM | POA: Insufficient documentation

## 2020-09-07 DIAGNOSIS — Z3480 Encounter for supervision of other normal pregnancy, unspecified trimester: Secondary | ICD-10-CM | POA: Insufficient documentation

## 2020-09-16 ENCOUNTER — Ambulatory Visit: Payer: Medicaid Other | Admitting: *Deleted

## 2020-09-16 ENCOUNTER — Other Ambulatory Visit: Payer: Self-pay

## 2020-09-16 ENCOUNTER — Ambulatory Visit: Payer: Medicaid Other | Attending: Obstetrics and Gynecology

## 2020-09-16 ENCOUNTER — Other Ambulatory Visit: Payer: Self-pay | Admitting: *Deleted

## 2020-09-16 ENCOUNTER — Encounter: Payer: Self-pay | Admitting: *Deleted

## 2020-09-16 DIAGNOSIS — Z148 Genetic carrier of other disease: Secondary | ICD-10-CM

## 2020-09-16 DIAGNOSIS — O365930 Maternal care for other known or suspected poor fetal growth, third trimester, not applicable or unspecified: Secondary | ICD-10-CM | POA: Diagnosis not present

## 2020-09-16 DIAGNOSIS — O365990 Maternal care for other known or suspected poor fetal growth, unspecified trimester, not applicable or unspecified: Secondary | ICD-10-CM | POA: Diagnosis present

## 2020-09-16 DIAGNOSIS — O10919 Unspecified pre-existing hypertension complicating pregnancy, unspecified trimester: Secondary | ICD-10-CM | POA: Diagnosis present

## 2020-09-16 DIAGNOSIS — O99213 Obesity complicating pregnancy, third trimester: Secondary | ICD-10-CM

## 2020-09-16 DIAGNOSIS — O10013 Pre-existing essential hypertension complicating pregnancy, third trimester: Secondary | ICD-10-CM

## 2020-09-16 DIAGNOSIS — O09293 Supervision of pregnancy with other poor reproductive or obstetric history, third trimester: Secondary | ICD-10-CM | POA: Diagnosis not present

## 2020-09-16 DIAGNOSIS — Z3A33 33 weeks gestation of pregnancy: Secondary | ICD-10-CM

## 2020-09-16 DIAGNOSIS — Z3480 Encounter for supervision of other normal pregnancy, unspecified trimester: Secondary | ICD-10-CM

## 2020-09-16 DIAGNOSIS — E669 Obesity, unspecified: Secondary | ICD-10-CM

## 2020-09-18 ENCOUNTER — Ambulatory Visit (INDEPENDENT_AMBULATORY_CARE_PROVIDER_SITE_OTHER): Payer: Medicaid Other | Admitting: Certified Nurse Midwife

## 2020-09-18 ENCOUNTER — Other Ambulatory Visit: Payer: Self-pay

## 2020-09-18 ENCOUNTER — Encounter: Payer: Self-pay | Admitting: Certified Nurse Midwife

## 2020-09-18 VITALS — BP 128/83 | HR 74 | Wt 234.0 lb

## 2020-09-18 DIAGNOSIS — O365990 Maternal care for other known or suspected poor fetal growth, unspecified trimester, not applicable or unspecified: Secondary | ICD-10-CM

## 2020-09-18 DIAGNOSIS — O10919 Unspecified pre-existing hypertension complicating pregnancy, unspecified trimester: Secondary | ICD-10-CM

## 2020-09-18 DIAGNOSIS — Z3480 Encounter for supervision of other normal pregnancy, unspecified trimester: Secondary | ICD-10-CM

## 2020-09-18 DIAGNOSIS — Z3A33 33 weeks gestation of pregnancy: Secondary | ICD-10-CM

## 2020-09-18 DIAGNOSIS — O99210 Obesity complicating pregnancy, unspecified trimester: Secondary | ICD-10-CM

## 2020-09-18 NOTE — Progress Notes (Signed)
   PRENATAL VISIT NOTE  Subjective:  Courtney Lyons is a 29 y.o. [id number] at [redacted]w[redacted]d being seen today for ongoing prenatal care.  She is currently monitored for the following issues for this high-risk pregnancy and has Obesity during pregnancy, antepartum; Supervision of other normal pregnancy, antepartum; Chronic hypertension in pregnancy; and Pregnancy affected by fetal growth restriction on their problem list.  Patient reports no complaints.  Contractions: Irritability. Vag. Bleeding: None.  Movement: Present. Denies leaking of fluid.   The following portions of the patient's history were reviewed and updated as appropriate: allergies, current medications, past family history, past medical history, past social history, past surgical history and problem list.   Objective:   Vitals:   09/18/20 1030  BP: 128/83  Pulse: 74  Weight: 234 lb (106.1 kg)    Fetal Status: Fetal Heart Rate (bpm): 148   Movement: Present     General:  Alert, oriented and cooperative. Patient is in no acute distress.  Skin: Skin is warm and dry. No rash noted.   Cardiovascular: Normal heart rate noted  Respiratory: Normal respiratory effort, no problems with respiration noted  Abdomen: Soft, gravid, appropriate for gestational age.  Pain/Pressure: Present     Pelvic: Cervical exam deferred        Extremities: Normal range of motion.  Edema: None  Mental Status: Normal mood and affect. Normal behavior. Normal judgment and thought content.   Assessment and Plan:  Pregnancy: [id number] at [redacted]w[redacted]d 1. Supervision of other normal pregnancy, antepartum - Patient doing well, no complaints at this time  - Anticipatory guidance on upcoming appointments  - Patient plans baby boy to have circumcision in hospital prior to discharge home   2. Chronic hypertension in pregnancy - BP stable at this time   3. Obesity during pregnancy, antepartum - TWG during pregnancy 14lbs   4. Pregnancy affected by fetal growth  restriction - Followed by MFM , next [place] for fetal growth scheduled for Monday - patient will continue to have weekly [place] until delivery.  - IOL scheduled for 12/29 - orders for admission placed   5. [redacted] weeks gestation of pregnancy  Preterm labor symptoms and general obstetric precautions including but not limited to vaginal bleeding, contractions, leaking of fluid and fetal movement were reviewed in detail with the patient. Please refer to After Visit Summary for other counseling recommendations.   Return in about 2 weeks (around 10/02/2020) for HROB, GBS, in person.  Future Appointments  Date Time Provider Department Center  09/21/2020 10:15 AM [facility] NURSE [facility]  09/21/2020 10:30 AM [facility] US3 [facility]  09/28/2020  3:30 PM [facility] NURSE [facility]  09/28/2020  3:45 PM [facility] US1 [facility]  10/02/2020 10:15 AM Adam Phenix, MD [facility] None  10/05/2020  8:45 AM [facility] NURSE [facility]  10/05/2020  9:00 AM [facility] US1 [facility]  10/12/2020 10:15 AM [facility] NURSE [facility]  10/12/2020 10:30 AM [facility] US3 [facility]    Sharyon Cable, CNM

## 2020-09-18 NOTE — Progress Notes (Signed)
ROB   CC: None    

## 2020-09-18 NOTE — Patient Instructions (Signed)

## 2020-09-21 ENCOUNTER — Other Ambulatory Visit: Payer: Self-pay | Admitting: Obstetrics

## 2020-09-21 ENCOUNTER — Encounter: Payer: Self-pay | Admitting: *Deleted

## 2020-09-21 ENCOUNTER — Ambulatory Visit: Payer: Medicaid Other | Attending: Obstetrics and Gynecology

## 2020-09-21 ENCOUNTER — Other Ambulatory Visit: Payer: Self-pay

## 2020-09-21 ENCOUNTER — Ambulatory Visit: Payer: Medicaid Other | Admitting: *Deleted

## 2020-09-21 DIAGNOSIS — O10919 Unspecified pre-existing hypertension complicating pregnancy, unspecified trimester: Secondary | ICD-10-CM

## 2020-09-21 DIAGNOSIS — O99213 Obesity complicating pregnancy, third trimester: Secondary | ICD-10-CM

## 2020-09-21 DIAGNOSIS — O09293 Supervision of pregnancy with other poor reproductive or obstetric history, third trimester: Secondary | ICD-10-CM | POA: Diagnosis not present

## 2020-09-21 DIAGNOSIS — O365990 Maternal care for other known or suspected poor fetal growth, unspecified trimester, not applicable or unspecified: Secondary | ICD-10-CM

## 2020-09-21 DIAGNOSIS — Z3480 Encounter for supervision of other normal pregnancy, unspecified trimester: Secondary | ICD-10-CM | POA: Diagnosis present

## 2020-09-21 DIAGNOSIS — O10013 Pre-existing essential hypertension complicating pregnancy, third trimester: Secondary | ICD-10-CM | POA: Diagnosis not present

## 2020-09-21 DIAGNOSIS — O365930 Maternal care for other known or suspected poor fetal growth, third trimester, not applicable or unspecified: Secondary | ICD-10-CM | POA: Diagnosis not present

## 2020-09-21 DIAGNOSIS — E669 Obesity, unspecified: Secondary | ICD-10-CM

## 2020-09-21 DIAGNOSIS — Z148 Genetic carrier of other disease: Secondary | ICD-10-CM

## 2020-09-21 DIAGNOSIS — Z3A34 34 weeks gestation of pregnancy: Secondary | ICD-10-CM

## 2020-09-21 NOTE — Procedures (Signed)
Courtney Lyons Dec 08, 1990 [redacted]w[redacted]d  Fetus A Non-Stress Test Interpretation for 09/21/20  Indication: IUGR  Fetal Heart Rate A Mode: External Baseline Rate (A): 140 bpm Variability: Moderate Accelerations: 15 x 15 Decelerations: None Multiple birth?: No  Uterine Activity Mode: Palpation, Toco Contraction Frequency (min): None Resting Tone Palpated: Relaxed Resting Time: Adequate  Interpretation (Fetal Testing) Nonstress Test Interpretation: Reactive Comments: Dr. Judeth Cornfield reviewed tracing.

## 2020-09-28 ENCOUNTER — Encounter: Payer: Self-pay | Admitting: *Deleted

## 2020-09-28 ENCOUNTER — Other Ambulatory Visit: Payer: Self-pay

## 2020-09-28 ENCOUNTER — Ambulatory Visit: Payer: Medicaid Other | Admitting: *Deleted

## 2020-09-28 ENCOUNTER — Ambulatory Visit: Payer: Medicaid Other | Attending: Obstetrics and Gynecology

## 2020-09-28 DIAGNOSIS — O09293 Supervision of pregnancy with other poor reproductive or obstetric history, third trimester: Secondary | ICD-10-CM | POA: Diagnosis not present

## 2020-09-28 DIAGNOSIS — O365990 Maternal care for other known or suspected poor fetal growth, unspecified trimester, not applicable or unspecified: Secondary | ICD-10-CM | POA: Diagnosis present

## 2020-09-28 DIAGNOSIS — O10013 Pre-existing essential hypertension complicating pregnancy, third trimester: Secondary | ICD-10-CM

## 2020-09-28 DIAGNOSIS — O10919 Unspecified pre-existing hypertension complicating pregnancy, unspecified trimester: Secondary | ICD-10-CM | POA: Insufficient documentation

## 2020-09-28 DIAGNOSIS — O368930 Maternal care for other specified fetal problems, third trimester, not applicable or unspecified: Secondary | ICD-10-CM | POA: Diagnosis not present

## 2020-09-28 DIAGNOSIS — Z3480 Encounter for supervision of other normal pregnancy, unspecified trimester: Secondary | ICD-10-CM | POA: Diagnosis present

## 2020-09-28 DIAGNOSIS — E669 Obesity, unspecified: Secondary | ICD-10-CM

## 2020-09-28 DIAGNOSIS — Z3A35 35 weeks gestation of pregnancy: Secondary | ICD-10-CM

## 2020-09-28 DIAGNOSIS — Z148 Genetic carrier of other disease: Secondary | ICD-10-CM

## 2020-09-28 DIAGNOSIS — O99213 Obesity complicating pregnancy, third trimester: Secondary | ICD-10-CM | POA: Diagnosis not present

## 2020-09-30 ENCOUNTER — Telehealth (HOSPITAL_COMMUNITY): Payer: Self-pay | Admitting: *Deleted

## 2020-09-30 ENCOUNTER — Encounter (HOSPITAL_COMMUNITY): Payer: Self-pay | Admitting: *Deleted

## 2020-09-30 NOTE — Telephone Encounter (Signed)
Preadmission screen  

## 2020-10-02 ENCOUNTER — Other Ambulatory Visit: Payer: Self-pay

## 2020-10-02 ENCOUNTER — Other Ambulatory Visit (HOSPITAL_COMMUNITY)
Admission: RE | Admit: 2020-10-02 | Discharge: 2020-10-02 | Disposition: A | Discharge status: Home / Self Care | Payer: Medicaid Other | Source: Ambulatory Visit | Attending: Obstetrics & Gynecology | Admitting: Obstetrics & Gynecology

## 2020-10-02 ENCOUNTER — Ambulatory Visit (INDEPENDENT_AMBULATORY_CARE_PROVIDER_SITE_OTHER): Payer: Medicaid Other | Admitting: Obstetrics & Gynecology

## 2020-10-02 DIAGNOSIS — O10919 Unspecified pre-existing hypertension complicating pregnancy, unspecified trimester: Secondary | ICD-10-CM

## 2020-10-02 DIAGNOSIS — Z3480 Encounter for supervision of other normal pregnancy, unspecified trimester: Secondary | ICD-10-CM | POA: Diagnosis not present

## 2020-10-02 DIAGNOSIS — O365990 Maternal care for other known or suspected poor fetal growth, unspecified trimester, not applicable or unspecified: Secondary | ICD-10-CM

## 2020-10-02 NOTE — Patient Instructions (Signed)

## 2020-10-02 NOTE — Progress Notes (Signed)
° °  PRENATAL VISIT NOTE  Subjective:  Courtney Lyons is a 29 y.o. [id number] at [redacted]w[redacted]d being seen today for ongoing prenatal care.  She is currently monitored for the following issues for this high-risk pregnancy and has Obesity during pregnancy, antepartum; Supervision of other normal pregnancy, antepartum; Chronic hypertension in pregnancy; and Pregnancy affected by fetal growth restriction on their problem list.  Patient reports no complaints.  Contractions: Irregular. Vag. Bleeding: None.  Movement: Present. Denies leaking of fluid.   The following portions of the patient's history were reviewed and updated as appropriate: allergies, current medications, past family history, past medical history, past social history, past surgical history and problem list.   Objective:   Vitals:   10/02/20 0951 10/02/20 0956  BP: (!) 138/91 131/88  Pulse: 82   Weight: 235 lb (106.6 kg)     Fetal Status: Fetal Heart Rate (bpm): 140   Movement: Present  Presentation: Vertex  General:  Alert, oriented and cooperative. Patient is in no acute distress.  Skin: Skin is warm and dry. No rash noted.   Cardiovascular: Normal heart rate noted  Respiratory: Normal respiratory effort, no problems with respiration noted  Abdomen: Soft, gravid, appropriate for gestational age.  Pain/Pressure: Present     Pelvic: Cervical exam performed in the presence of a chaperone Dilation: Closed Effacement (%): 30 Station: Ballotable  Extremities: Normal range of motion.     Mental Status: Normal mood and affect. Normal behavior. Normal judgment and thought content.   Assessment and Plan:  Pregnancy: [id number] at [redacted]w[redacted]d 1. Pregnancy affected by fetal growth restriction 8%ile  2. Chronic hypertension in pregnancy BP stable no meds  3. Supervision of other normal pregnancy, antepartum Routine testing - Strep Gp B NAA - Cervicovaginal ancillary only( [place])  Preterm labor symptoms and general obstetric precautions  including but not limited to vaginal bleeding, contractions, leaking of fluid and fetal movement were reviewed in detail with the patient. Please refer to After Visit Summary for other counseling recommendations.   Return if symptoms worsen or fail to improve, for IOL 12/29.  Future Appointments  Date Time Provider Department Center  10/05/2020  8:45 AM [facility] NURSE [facility]  10/05/2020  9:00 AM [facility] US1 [facility]  10/12/2020 10:15 AM [facility] NURSE [facility]  10/12/2020 10:30 AM [facility] US3 [facility]  10/12/2020 12:15 PM [facility]-SCREENING [facility] None  10/14/2020  6:30 AM MC-LD SCHED ROOM [facility] None    Scheryl Darter, MD

## 2020-10-04 ENCOUNTER — Other Ambulatory Visit: Payer: Self-pay | Admitting: Advanced Practice Midwife

## 2020-10-04 LAB — STREP GP B NAA: Strep Gp B NAA: POSITIVE — AB

## 2020-10-05 ENCOUNTER — Other Ambulatory Visit: Payer: Self-pay

## 2020-10-05 ENCOUNTER — Ambulatory Visit: Payer: Medicaid Other | Admitting: *Deleted

## 2020-10-05 ENCOUNTER — Other Ambulatory Visit: Payer: Self-pay | Admitting: *Deleted

## 2020-10-05 ENCOUNTER — Ambulatory Visit: Payer: Medicaid Other | Attending: Obstetrics and Gynecology

## 2020-10-05 ENCOUNTER — Other Ambulatory Visit: Payer: Self-pay | Admitting: Advanced Practice Midwife

## 2020-10-05 ENCOUNTER — Encounter: Payer: Self-pay | Admitting: *Deleted

## 2020-10-05 DIAGNOSIS — Z3480 Encounter for supervision of other normal pregnancy, unspecified trimester: Secondary | ICD-10-CM | POA: Diagnosis present

## 2020-10-05 DIAGNOSIS — O10919 Unspecified pre-existing hypertension complicating pregnancy, unspecified trimester: Secondary | ICD-10-CM | POA: Insufficient documentation

## 2020-10-05 DIAGNOSIS — O10013 Pre-existing essential hypertension complicating pregnancy, third trimester: Secondary | ICD-10-CM | POA: Diagnosis not present

## 2020-10-05 DIAGNOSIS — Z148 Genetic carrier of other disease: Secondary | ICD-10-CM

## 2020-10-05 DIAGNOSIS — O365930 Maternal care for other known or suspected poor fetal growth, third trimester, not applicable or unspecified: Secondary | ICD-10-CM | POA: Diagnosis not present

## 2020-10-05 DIAGNOSIS — O99213 Obesity complicating pregnancy, third trimester: Secondary | ICD-10-CM

## 2020-10-05 DIAGNOSIS — Z3A36 36 weeks gestation of pregnancy: Secondary | ICD-10-CM

## 2020-10-05 DIAGNOSIS — O365990 Maternal care for other known or suspected poor fetal growth, unspecified trimester, not applicable or unspecified: Secondary | ICD-10-CM

## 2020-10-05 DIAGNOSIS — O09293 Supervision of pregnancy with other poor reproductive or obstetric history, third trimester: Secondary | ICD-10-CM

## 2020-10-05 DIAGNOSIS — E669 Obesity, unspecified: Secondary | ICD-10-CM

## 2020-10-05 LAB — CERVICOVAGINAL ANCILLARY ONLY
Chlamydia: NEGATIVE
Comment: NEGATIVE
Comment: NORMAL
Neisseria Gonorrhea: NEGATIVE

## 2020-10-12 ENCOUNTER — Ambulatory Visit (HOSPITAL_BASED_OUTPATIENT_CLINIC_OR_DEPARTMENT_OTHER): Payer: Medicaid Other

## 2020-10-12 ENCOUNTER — Other Ambulatory Visit: Payer: Self-pay

## 2020-10-12 ENCOUNTER — Other Ambulatory Visit
Admission: RE | Admit: 2020-10-12 | Discharge: 2020-10-12 | Disposition: A | Discharge status: Home / Self Care | Payer: Medicaid Other | Source: Ambulatory Visit | Attending: Family Medicine | Admitting: Family Medicine

## 2020-10-12 ENCOUNTER — Encounter: Payer: Self-pay | Admitting: *Deleted

## 2020-10-12 ENCOUNTER — Ambulatory Visit: Payer: Medicaid Other | Admitting: *Deleted

## 2020-10-12 DIAGNOSIS — E669 Obesity, unspecified: Secondary | ICD-10-CM | POA: Diagnosis not present

## 2020-10-12 DIAGNOSIS — O365930 Maternal care for other known or suspected poor fetal growth, third trimester, not applicable or unspecified: Secondary | ICD-10-CM | POA: Insufficient documentation

## 2020-10-12 DIAGNOSIS — Z148 Genetic carrier of other disease: Secondary | ICD-10-CM | POA: Insufficient documentation

## 2020-10-12 DIAGNOSIS — Z3A37 37 weeks gestation of pregnancy: Secondary | ICD-10-CM | POA: Diagnosis not present

## 2020-10-12 DIAGNOSIS — Z362 Encounter for other antenatal screening follow-up: Secondary | ICD-10-CM

## 2020-10-12 DIAGNOSIS — O09293 Supervision of pregnancy with other poor reproductive or obstetric history, third trimester: Secondary | ICD-10-CM

## 2020-10-12 DIAGNOSIS — Z20822 Contact with and (suspected) exposure to covid-19: Secondary | ICD-10-CM | POA: Insufficient documentation

## 2020-10-12 DIAGNOSIS — O10919 Unspecified pre-existing hypertension complicating pregnancy, unspecified trimester: Secondary | ICD-10-CM

## 2020-10-12 DIAGNOSIS — O368930 Maternal care for other specified fetal problems, third trimester, not applicable or unspecified: Secondary | ICD-10-CM

## 2020-10-12 DIAGNOSIS — Z01812 Encounter for preprocedural laboratory examination: Secondary | ICD-10-CM | POA: Insufficient documentation

## 2020-10-12 DIAGNOSIS — O10913 Unspecified pre-existing hypertension complicating pregnancy, third trimester: Secondary | ICD-10-CM | POA: Insufficient documentation

## 2020-10-12 DIAGNOSIS — O365990 Maternal care for other known or suspected poor fetal growth, unspecified trimester, not applicable or unspecified: Secondary | ICD-10-CM

## 2020-10-12 DIAGNOSIS — O10013 Pre-existing essential hypertension complicating pregnancy, third trimester: Secondary | ICD-10-CM

## 2020-10-12 DIAGNOSIS — O99213 Obesity complicating pregnancy, third trimester: Secondary | ICD-10-CM | POA: Insufficient documentation

## 2020-10-12 DIAGNOSIS — Z3480 Encounter for supervision of other normal pregnancy, unspecified trimester: Secondary | ICD-10-CM

## 2020-10-13 ENCOUNTER — Encounter: Payer: Self-pay | Admitting: Family Medicine

## 2020-10-13 DIAGNOSIS — R8271 Bacteriuria: Secondary | ICD-10-CM | POA: Insufficient documentation

## 2020-10-13 LAB — SARS CORONAVIRUS 2 (TAT 6-24 HRS): SARS Coronavirus 2: NEGATIVE

## 2020-10-14 ENCOUNTER — Inpatient Hospital Stay (HOSPITAL_COMMUNITY): Payer: Medicaid Other

## 2020-10-14 ENCOUNTER — Other Ambulatory Visit: Payer: Self-pay

## 2020-10-14 ENCOUNTER — Encounter (HOSPITAL_COMMUNITY): Payer: Self-pay | Admitting: Family Medicine

## 2020-10-14 ENCOUNTER — Inpatient Hospital Stay (HOSPITAL_COMMUNITY)
Admission: AD | Admit: 2020-10-14 | Discharge: 2020-10-16 | DRG: 806 | Disposition: A | Discharge status: Home / Self Care | Payer: Medicaid Other | Attending: Family Medicine | Admitting: Family Medicine

## 2020-10-14 DIAGNOSIS — E669 Obesity, unspecified: Secondary | ICD-10-CM | POA: Diagnosis present

## 2020-10-14 DIAGNOSIS — O10913 Unspecified pre-existing hypertension complicating pregnancy, third trimester: Secondary | ICD-10-CM | POA: Diagnosis not present

## 2020-10-14 DIAGNOSIS — D509 Iron deficiency anemia, unspecified: Secondary | ICD-10-CM

## 2020-10-14 DIAGNOSIS — Z3A37 37 weeks gestation of pregnancy: Secondary | ICD-10-CM | POA: Diagnosis not present

## 2020-10-14 DIAGNOSIS — O99214 Obesity complicating childbirth: Secondary | ICD-10-CM | POA: Diagnosis present

## 2020-10-14 DIAGNOSIS — O99019 Anemia complicating pregnancy, unspecified trimester: Secondary | ICD-10-CM | POA: Diagnosis present

## 2020-10-14 DIAGNOSIS — O365930 Maternal care for other known or suspected poor fetal growth, third trimester, not applicable or unspecified: Secondary | ICD-10-CM | POA: Diagnosis present

## 2020-10-14 DIAGNOSIS — O10919 Unspecified pre-existing hypertension complicating pregnancy, unspecified trimester: Secondary | ICD-10-CM | POA: Diagnosis present

## 2020-10-14 DIAGNOSIS — O99824 Streptococcus B carrier state complicating childbirth: Secondary | ICD-10-CM | POA: Diagnosis present

## 2020-10-14 DIAGNOSIS — O1002 Pre-existing essential hypertension complicating childbirth: Secondary | ICD-10-CM | POA: Diagnosis present

## 2020-10-14 DIAGNOSIS — O365990 Maternal care for other known or suspected poor fetal growth, unspecified trimester, not applicable or unspecified: Secondary | ICD-10-CM | POA: Diagnosis present

## 2020-10-14 DIAGNOSIS — R8271 Bacteriuria: Secondary | ICD-10-CM | POA: Diagnosis present

## 2020-10-14 DIAGNOSIS — D649 Anemia, unspecified: Secondary | ICD-10-CM | POA: Diagnosis present

## 2020-10-14 DIAGNOSIS — O9902 Anemia complicating childbirth: Secondary | ICD-10-CM | POA: Diagnosis present

## 2020-10-14 DIAGNOSIS — O99210 Obesity complicating pregnancy, unspecified trimester: Secondary | ICD-10-CM | POA: Diagnosis present

## 2020-10-14 DIAGNOSIS — Z3480 Encounter for supervision of other normal pregnancy, unspecified trimester: Secondary | ICD-10-CM

## 2020-10-14 LAB — COMPREHENSIVE METABOLIC PANEL
ALT: 14 U/L (ref 0–44)
AST: 28 U/L (ref 15–41)
Albumin: 2.6 g/dL — ABNORMAL LOW (ref 3.5–5.0)
Alkaline Phosphatase: 87 U/L (ref 38–126)
Anion gap: 9 (ref 5–15)
BUN: 8 mg/dL (ref 6–20)
CO2: 21 mmol/L — ABNORMAL LOW (ref 22–32)
Calcium: 8.8 mg/dL — ABNORMAL LOW (ref 8.9–10.3)
Chloride: 105 mmol/L (ref 98–111)
Creatinine, Ser: 0.59 mg/dL (ref 0.44–1.00)
GFR, Estimated: >60 mL/min (ref >60)
Glucose, Bld: 106 mg/dL — ABNORMAL HIGH (ref 70–99)
Potassium: 3.6 mmol/L (ref 3.5–5.1)
Sodium: 135 mmol/L (ref 135–145)
Total Bilirubin: 0.3 mg/dL (ref 0.3–1.2)
Total Protein: 6.3 g/dL — ABNORMAL LOW (ref 6.5–8.1)

## 2020-10-14 LAB — CBC
HCT: 29 % — ABNORMAL LOW (ref 36.0–46.0)
Hemoglobin: 9 g/dL — ABNORMAL LOW (ref 12.0–15.0)
MCH: 24.7 pg — ABNORMAL LOW (ref 26.0–34.0)
MCHC: 31 g/dL (ref 30.0–36.0)
MCV: 79.5 fL — ABNORMAL LOW (ref 80.0–100.0)
Platelets: 291 10*3/uL (ref 150–400)
RBC: 3.65 MIL/uL — ABNORMAL LOW (ref 3.87–5.11)
RDW: 18.1 % — ABNORMAL HIGH (ref 11.5–15.5)
WBC: 8.6 10*3/uL (ref 4.0–10.5)
nRBC: 0 % (ref 0.0–0.2)

## 2020-10-14 LAB — PROTEIN / CREATININE RATIO, URINE
Creatinine, Urine: 94.36 mg/dL
Protein Creatinine Ratio: 0.07 mg/mg{Cre} (ref 0.00–0.15)
Total Protein, Urine: 7 mg/dL

## 2020-10-14 LAB — TYPE AND SCREEN
ABO/RH(D): A POS
Antibody Screen: NEGATIVE

## 2020-10-14 MED ORDER — SODIUM CHLORIDE 0.9 % IV SOLN
5.0000 10*6.[IU] | Freq: Once | INTRAVENOUS | Status: AC
  Administered 2020-10-14: 16:00:00 5 10*6.[IU] via INTRAVENOUS
  Filled 2020-10-14: qty 5

## 2020-10-14 MED ORDER — LACTATED RINGERS IV SOLN: 500.0000 mL | Freq: Once | INTRAVENOUS | Status: DC

## 2020-10-14 MED ORDER — LACTATED RINGERS IV SOLN: 500.0000 mL | INTRAVENOUS | Status: DC | PRN

## 2020-10-14 MED ORDER — DIPHENHYDRAMINE HCL 50 MG/ML IJ SOLN: 12.5000 mg | INTRAMUSCULAR | Status: DC | PRN

## 2020-10-14 MED ORDER — LIDOCAINE HCL (PF) 1 % IJ SOLN
30.0000 mL | INTRAMUSCULAR | Status: DC | PRN
Start: 2020-10-14 — End: 2020-10-15

## 2020-10-14 MED ORDER — MISOPROSTOL 25 MCG QUARTER TABLET
25.0000 ug | ORAL_TABLET | ORAL | Status: DC | PRN
  Administered 2020-10-14 (×2): 25 ug via VAGINAL
  Filled 2020-10-14 (×2): qty 1

## 2020-10-14 MED ORDER — FENTANYL CITRATE (PF) 100 MCG/2ML IJ SOLN
100.0000 ug | INTRAMUSCULAR | Status: DC | PRN
  Administered 2020-10-14 – 2020-10-15 (×6): 100 ug via INTRAVENOUS
  Filled 2020-10-14 (×6): qty 2

## 2020-10-14 MED ORDER — ONDANSETRON HCL 4 MG/2ML IJ SOLN: 4.0000 mg | Freq: Four times a day (QID) | INTRAMUSCULAR | Status: DC | PRN

## 2020-10-14 MED ORDER — TERBUTALINE SULFATE 1 MG/ML IJ SOLN: 0.2500 mg | Freq: Once | INTRAMUSCULAR | Status: DC | PRN

## 2020-10-14 MED ORDER — PENICILLIN G POT IN DEXTROSE 60000 UNIT/ML IV SOLN
3.0000 10*6.[IU] | INTRAVENOUS | Status: DC
  Administered 2020-10-14 – 2020-10-15 (×2): 3 10*6.[IU] via INTRAVENOUS
  Filled 2020-10-14 (×3): qty 50

## 2020-10-14 MED ORDER — PHENYLEPHRINE 40 MCG/ML (10ML) SYRINGE FOR IV PUSH (FOR BLOOD PRESSURE SUPPORT): 80.0000 ug | PREFILLED_SYRINGE | INTRAVENOUS | Status: DC | PRN

## 2020-10-14 MED ORDER — EPHEDRINE 5 MG/ML INJ: 10.0000 mg | INTRAVENOUS | Status: DC | PRN

## 2020-10-14 MED ORDER — LACTATED RINGERS IV SOLN: INTRAVENOUS | Status: DC

## 2020-10-14 MED ORDER — OXYTOCIN BOLUS FROM INFUSION
333.0000 mL | Freq: Once | INTRAVENOUS | Status: AC
  Administered 2020-10-15: 06:00:00 333 mL via INTRAVENOUS

## 2020-10-14 MED ORDER — OXYTOCIN-SODIUM CHLORIDE 30-0.9 UT/500ML-% IV SOLN
2.5000 [IU]/h | INTRAVENOUS | Status: DC
  Filled 2020-10-14: qty 500

## 2020-10-14 MED ORDER — ACETAMINOPHEN 325 MG PO TABS
650.0000 mg | ORAL_TABLET | ORAL | Status: DC | PRN
  Administered 2020-10-14: 650 mg via ORAL
  Filled 2020-10-14: qty 2

## 2020-10-14 MED ORDER — FENTANYL-BUPIVACAINE-NACL 0.5-0.125-0.9 MG/250ML-% EP SOLN: 12.0000 mL/h | EPIDURAL | Status: DC | PRN

## 2020-10-14 MED ORDER — SOD CITRATE-CITRIC ACID 500-334 MG/5ML PO SOLN: 30.0000 mL | ORAL | Status: DC | PRN

## 2020-10-14 NOTE — H&P (Signed)
HPI: Courtney Lyons is a 29 y.o. year old [id number] female at [redacted]w[redacted]d weeks gestation who presents to L&D for IOL for fetal growth restriction (Nml dopplers) and CHTN (no meds).   EFW 5-13, 13%, Nml AFI on 10/12/20   Nursing Staff Provider  Office Location  [facility] Dating  LMP and 19 week [place]  Language   English Anatomy US  FGR  Flu Vaccine  Declined 06/23/20  Genetic Screen  NIPS: low risk female   AFP:    Silent carrier alpha thal  TDaP vaccine   11/29/18 Hgb A1C or  GTT Early  Third trimester 2 hour wnl Component     Latest Ref Rng & Units 08/04/2020  Glucose, Fasting     65 - 91 mg/dL 83  Glucose, 1 hour     65 - 179 mg/dL 314  Glucose, 2 hour     65 - 152 mg/dL 99    Rhogam  NA   LAB RESULTS     Blood Type A/Positive/-- (07/13 1146)   Feeding Plan Both Antibody Negative (07/13 1146)  Contraception Nuva Ring  Rubella 3.89 (07/13 1146)  Circumcision Yes if boy  RPR Non Reactive (07/13 1146)   Pediatrician  [facility] HBsAg Negative (07/13 1146)   Support Person FOB  HCVAb Negative  Prenatal Classes No  HIV Non Reactive (07/13 1146)     BTL Consent  GBS  (For PCN allergy, check sensitivities)   VBAC Consent  Pap 04/28/20 normal     Hgb Electro  Nml  BP Cuff Yes  CF Nml    SMA Nml    Waterbirth  [ ]  Class [ ]  Consent [ ]  CNM visit    Induction  [ ]  Orders Entered [ ] Foley Y/N    OB History    Gravida  18   Para  3   Term  3   Preterm      AB  14   Living  3     SAB      IAB  14   Ectopic      Multiple  0   Live Births  3          Past Medical History:  Diagnosis Date  . Anemia   . Gonorrhea of mother during pregnancy, first trimester 09/06/2018  . Indication for care in labor and delivery, antepartum 02/18/2019  . Iron deficiency anemia of pregnancy 11/01/2018  . Large for gestational age fetus affecting management of mother 12/13/2018  . Pregnancy induced hypertension    Past Surgical History:  Procedure Laterality Date  . NO PAST SURGERIES      Family History: family history includes Asthma in her daughter; Birth defects in her brother; Cancer in her paternal aunt; Diabetes in her father; Early death in her paternal aunt; Hypertension in her mother; Stroke in her mother; Vision loss in her paternal grandfather. Social History:  reports that she has never smoked. She has never used smokeless tobacco. She reports previous alcohol use. She reports previous drug use. Drug: Marijuana.     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 CHTN, no meds   Review of Systems Maternal Medical History:  Reason for admission: Induction of labor  Contractions: Frequency:  rare.   Perceived severity is mild.    Fetal activity: Perceived fetal activity is normal.    Prenatal complications: PIH and IUGR.   No bleeding, oligohydramnios, placental abnormality, polyhydramnios or pre-eclampsia.   Prenatal Complications - Diabetes: none.    Dilation: Closed Effacement (%): Thick Station: -3 Exam by:: J.Cox, RN  Vertex by BS [place] Blood pressure 140/87, pulse 90, temperature 99 F (37.2 C), temperature source Oral, height 5\' 4"  (1.626 m), weight 108.8 kg, last menstrual period 01/25/2020, not currently breastfeeding. Maternal Exam:  Uterine Assessment: Contraction strength is mild.  Contraction frequency is rare.   Abdomen: Patient reports no abdominal tenderness. Estimated fetal weight is 5-13.   Fetal presentation: vertex  Introitus: Normal vulva. Vulva is negative for lesion.  Pelvis: adequate for delivery.   Cervix: Cervix evaluated by digital exam.     Fetal Exam Fetal Monitor Review: Baseline rate: 135.  Variability: moderate (6-25 bpm).    Pattern: accelerations present and variable decelerations.   Variable decel x 1  Fetal State Assessment: Category I - tracings are normal.     Physical Exam Constitutional:      General: She is not in acute distress.    Appearance: Normal appearance. She is obese. She is not ill-appearing.  HENT:     Head: Normocephalic.  Cardiovascular:     Rate and Rhythm: Normal rate and regular rhythm.  Pulmonary:     Effort: Pulmonary effort is normal.     Breath sounds: Normal breath sounds.  Abdominal:     Tenderness: There is no abdominal tenderness.  Genitourinary:    General: Normal vulva.  Vulva is no lesion.  Musculoskeletal:        General: No tenderness.     Right lower leg: Edema present.     Left lower leg: Edema present.  Skin:    General: Skin is warm and dry.  Neurological:     General: No focal deficit present.     Mental Status: She is alert and oriented to person, place, and time.     Deep Tendon Reflexes: Reflexes normal.  Psychiatric:        Mood and Affect: Mood normal.     Prenatal labs: ABO, Rh: --/--/A POS (12/29 1420) Antibody: NEG (12/29 1420) Rubella: 3.89 (07/13 1146) RPR: Non Reactive (10/19 1135)  HBsAg: Negative (07/13 1146)  HIV: Non Reactive (10/19 1135)  GBS: Positive/-- (12/17 1112)   Assessment: 1. Labor: IOL 2. Fetal Wellbeing: Category I  3. Pain Control: Comfort measures 4. GBS: Pos 5. 37.4 week IUP 6. CHTN 7. Fetal growth restriction w/ Nml dopplers  Plan:  1. Admit to BS per consult with MD 2. Routine L&D orders 3. Analgesia/anesthesia PRN  4. Pre- labs 5. PCN for GBS 6. Cytotec. Consider Foley bulb when 1-2 cm.   10-08-1984 10/14/2020, 4:32 PM

## 2020-10-14 NOTE — Progress Notes (Signed)
Labor Progress Note Courtney Lyons is a 29 y.o. [id number] at [redacted]w[redacted]d presented for IOL secondary to [facility] and FGR.  S: Pt reports moderate discomfort with contractions. No headache, vision changes or other concerns.   O:  BP (!) 151/92 (BP Location: Left Arm)   Pulse 76   Temp 98.3 F (36.8 C) (Oral)   Resp 18   Ht 5\' 4"  (1.626 m)   Wt 108.8 kg   LMP 01/25/2020   BMI 41.16 kg/m  EFM: baseline 130/moderate variability/+accels, no decels Toco: ctx q2-3 min  CVE: Dilation: 2 Effacement (%): 50 Station: -2 Presentation: Vertex Exam by:: Lakeyshia Tuckerman, MD   A&P: Courtney y.o. 36 [redacted]w[redacted]d presented for IOL secondary to cHTN and FGR. #Labor: Minimal cervical change s/p cytotec x2. Now s/p FB at 2200. Will plan to transition to pitocin 4 hours s/p last cytotec. #Pain: plan for epidural per pt request #FWB: Category 1 strip #GBS positive; now adequate with PCN #cHTN: no medications in pregnancy. Blood pressure most recently in mild range. Asymptomatic. Preeclampsia labs unremarkable except for Hgb 9.0. UP:C 0.07. Will continue to monitor closely. #Anemia: Hgb 9.0 on admission. Will plan for iron supplementation in PP period.  [redacted]w[redacted]d, MD OB Fellow, Faculty Practice 10/14/2020 10:26 PM

## 2020-10-15 ENCOUNTER — Encounter (HOSPITAL_COMMUNITY): Payer: Self-pay | Admitting: Family Medicine

## 2020-10-15 DIAGNOSIS — O99824 Streptococcus B carrier state complicating childbirth: Secondary | ICD-10-CM

## 2020-10-15 DIAGNOSIS — Z3A37 37 weeks gestation of pregnancy: Secondary | ICD-10-CM

## 2020-10-15 DIAGNOSIS — O10913 Unspecified pre-existing hypertension complicating pregnancy, third trimester: Secondary | ICD-10-CM

## 2020-10-15 DIAGNOSIS — O365930 Maternal care for other known or suspected poor fetal growth, third trimester, not applicable or unspecified: Secondary | ICD-10-CM

## 2020-10-15 LAB — RPR: RPR Ser Ql: NONREACTIVE

## 2020-10-15 MED ORDER — ONDANSETRON HCL 4 MG PO TABS: 4.0000 mg | ORAL_TABLET | ORAL | Status: DC | PRN

## 2020-10-15 MED ORDER — DIBUCAINE (PERIANAL) 1 % EX OINT: 1.0000 "application " | TOPICAL_OINTMENT | CUTANEOUS | Status: DC | PRN

## 2020-10-15 MED ORDER — LABETALOL HCL 5 MG/ML IV SOLN
80.0000 mg | INTRAVENOUS | Status: DC | PRN
  Administered 2020-10-15: 06:00:00 80 mg via INTRAVENOUS
  Filled 2020-10-15: qty 16

## 2020-10-15 MED ORDER — ACETAMINOPHEN 325 MG PO TABS
650.0000 mg | ORAL_TABLET | Freq: Four times a day (QID) | ORAL | Status: DC
  Administered 2020-10-15 – 2020-10-16 (×5): 650 mg via ORAL
  Filled 2020-10-15 (×5): qty 2

## 2020-10-15 MED ORDER — OXYTOCIN-SODIUM CHLORIDE 30-0.9 UT/500ML-% IV SOLN
1.0000 m[IU]/min | INTRAVENOUS | Status: DC
  Administered 2020-10-15: 01:00:00 1 m[IU]/min via INTRAVENOUS

## 2020-10-15 MED ORDER — BENZOCAINE-MENTHOL 20-0.5 % EX AERO: 1.0000 "application " | INHALATION_SPRAY | CUTANEOUS | Status: DC | PRN

## 2020-10-15 MED ORDER — TETANUS-DIPHTH-ACELL PERTUSSIS 5-2.5-18.5 LF-MCG/0.5 IM SUSY: 0.5000 mL | PREFILLED_SYRINGE | Freq: Once | INTRAMUSCULAR | Status: DC

## 2020-10-15 MED ORDER — LABETALOL HCL 5 MG/ML IV SOLN: 20.0000 mg | INTRAVENOUS | Status: DC | PRN

## 2020-10-15 MED ORDER — LABETALOL HCL 5 MG/ML IV SOLN
40.0000 mg | INTRAVENOUS | Status: DC | PRN
  Administered 2020-10-15: 05:00:00 40 mg via INTRAVENOUS
  Filled 2020-10-15: qty 8

## 2020-10-15 MED ORDER — MAGNESIUM SULFATE 40 GM/1000ML IV SOLN: 2.0000 g/h | INTRAVENOUS | Status: DC

## 2020-10-15 MED ORDER — DIPHENHYDRAMINE HCL 25 MG PO CAPS: 25.0000 mg | ORAL_CAPSULE | Freq: Four times a day (QID) | ORAL | Status: DC | PRN

## 2020-10-15 MED ORDER — MAGNESIUM SULFATE BOLUS VIA INFUSION
4.0000 g | Freq: Once | INTRAVENOUS | Status: DC
  Filled 2020-10-15: qty 1000

## 2020-10-15 MED ORDER — LABETALOL HCL 5 MG/ML IV SOLN
20.0000 mg | INTRAVENOUS | Status: DC | PRN
  Administered 2020-10-15: 05:00:00 20 mg via INTRAVENOUS

## 2020-10-15 MED ORDER — IBUPROFEN 600 MG PO TABS
600.0000 mg | ORAL_TABLET | Freq: Four times a day (QID) | ORAL | Status: DC
  Administered 2020-10-15 – 2020-10-16 (×3): 600 mg via ORAL
  Filled 2020-10-15 (×3): qty 1

## 2020-10-15 MED ORDER — COCONUT OIL OIL: 1.0000 "application " | TOPICAL_OIL | Status: DC | PRN

## 2020-10-15 MED ORDER — TERBUTALINE SULFATE 1 MG/ML IJ SOLN: 0.2500 mg | Freq: Once | INTRAMUSCULAR | Status: DC | PRN

## 2020-10-15 MED ORDER — SIMETHICONE 80 MG PO CHEW: 80.0000 mg | CHEWABLE_TABLET | ORAL | Status: DC | PRN

## 2020-10-15 MED ORDER — WITCH HAZEL-GLYCERIN EX PADS: 1.0000 "application " | MEDICATED_PAD | CUTANEOUS | Status: DC | PRN

## 2020-10-15 MED ORDER — FERROUS SULFATE 325 (65 FE) MG PO TABS
325.0000 mg | ORAL_TABLET | ORAL | Status: DC
  Administered 2020-10-15: 10:00:00 325 mg via ORAL
  Filled 2020-10-15: qty 1

## 2020-10-15 MED ORDER — SENNOSIDES-DOCUSATE SODIUM 8.6-50 MG PO TABS
2.0000 | ORAL_TABLET | Freq: Every day | ORAL | Status: DC
  Administered 2020-10-16: 2 via ORAL
  Filled 2020-10-15: qty 2

## 2020-10-15 MED ORDER — PRENATAL MULTIVITAMIN CH
1.0000 | ORAL_TABLET | Freq: Every day | ORAL | Status: DC
  Administered 2020-10-15: 11:00:00 1 via ORAL
  Filled 2020-10-15: qty 1

## 2020-10-15 MED ORDER — HYDRALAZINE HCL 20 MG/ML IJ SOLN: 10.0000 mg | INTRAMUSCULAR | Status: DC | PRN

## 2020-10-15 MED ORDER — ONDANSETRON HCL 4 MG/2ML IJ SOLN: 4.0000 mg | INTRAMUSCULAR | Status: DC | PRN

## 2020-10-15 MED ORDER — LABETALOL HCL 5 MG/ML IV SOLN: 40.0000 mg | INTRAVENOUS | Status: DC | PRN

## 2020-10-15 MED ORDER — LABETALOL HCL 5 MG/ML IV SOLN
INTRAVENOUS | Status: AC
  Filled 2020-10-15: qty 4

## 2020-10-15 MED ORDER — LABETALOL HCL 5 MG/ML IV SOLN: 80.0000 mg | INTRAVENOUS | Status: DC | PRN

## 2020-10-15 NOTE — Discharge Instructions (Signed)

## 2020-10-15 NOTE — Discharge Summary (Signed)
Postpartum Discharge Summary     Patient Name: Courtney Lyons DOB: 07-30-1991 MRN: [id number]  Date of admission: 10/14/2020 Delivery date:10/15/2020  Delivering provider: Randa Ngo  Date of discharge: 10/16/2020  Admitting diagnosis: Pregnancy affected by fetal growth restriction [O36.5990] Intrauterine pregnancy: [redacted]w[redacted]d    Secondary diagnosis:  Principal Problem:   Vaginal delivery Active Problems:   Obesity during pregnancy, antepartum   Anemia of pregnancy   Chronic hypertension in pregnancy   Pregnancy affected by fetal growth restriction   GBS bacteriuria  Additional problems: as noted above  Discharge diagnosis: Vaginal delivery delivered                                         Post partum procedures:none Augmentation: AROM, Pitocin, Cytotec and IP Foley Complications: None  Hospital course: Induction of Labor With Vaginal Delivery   29y.o. yo [id number] [id number] admitted to the hospital 10/14/2020 for induction of labor.  Indication for induction: FGR and cHTN. Normal preeclampsia labs on admission. Patient had an uncomplicated labor course as follows: Membrane Rupture Time/Date: 3:49 AM ,10/15/2020   Delivery Method:Vaginal, Spontaneous  Episiotomy: None  Lacerations:  None  Details of delivery can be found in separate delivery note.  Patient had a routine postpartum course. Patient is discharged home 10/16/20. Pt discharged on norvasc 26m59maily given elevated blood pressures in postpartum period.  Newborn Data: Birth date:10/15/2020  Birth time:5:43 AM  Gender:Female  Living status:Living  Apgars:8 ,8  Weight:2566 g   Magnesium Sulfate received: No BMZ received: No Rhophylac:N/A MMR:N/A T-DaP:Given prenatally Flu: offered prior to discharge Transfusion:No  Physical exam  Vitals:   10/15/20 2030 10/15/20 2203 10/16/20 0030 10/16/20 0445  BP: (!) 147/105 136/85 (!) 142/82 (!) 156/99  Pulse: 87 76 80 75  Resp: _0 Temp: 97.7 F  (36.5 C)  98 F (36.7 C) (!) 97.4 F (36.3 C)  TempSrc: Oral  Oral Oral  SpO2:   100%   Weight:      Height:       General: alert, cooperative and no distress Lochia: appropriate Uterine Fundus: firm Incision: N/A DVT Evaluation: No evidence of DVT seen on physical exam. No cords or calf tenderness. No significant calf/ankle edema. Labs: Lab Results  Component Value Date   WBC 8.6 10/14/2020   HGB 9.0 (L) 10/14/2020   HCT 29.0 (L) 10/14/2020   MCV 79.5 (L) 10/14/2020   PLT 291 10/14/2020   CMP Latest Ref Rng & Units 10/14/2020  Glucose 70 - 99 mg/dL 106(H)  BUN 6 - 20 mg/dL 8  Creatinine 0.44 - 1.00 mg/dL 0.59  Sodium 135 - 145 mmol/L 135  Potassium 3.5 - 5.1 mmol/L 3.6  Chloride 98 - 111 mmol/L 105  CO2 22 - 32 mmol/L 21(L)  Calcium 8.9 - 10.3 mg/dL 8.8(L)  Total Protein 6.5 - 8.1 g/dL 6.3(L)  Total Bilirubin 0.3 - 1.2 mg/dL 0.3  Alkaline Phos 38 - 126 U/L 87  AST 15 - 41 U/L 28  ALT 0 - 44 U/L 14   Edinburgh Score: Edinburgh Postnatal Depression Scale Screening Tool 10/15/2020  I have been able to laugh and see the funny side of things. 0  I have looked forward with enjoyment to things. 0  I have blamed myself unnecessarily when things went wrong. 0  I have been anxious or worried  for no good reason. 0  I have felt scared or panicky for no good reason. 0  Things have been getting on top of me. 0  I have been so unhappy that I have had difficulty sleeping. 0  I have felt sad or miserable. 0  I have been so unhappy that I have been crying. 0  The thought of harming myself has occurred to me. 0  Edinburgh Postnatal Depression Scale Total 0     After visit meds:  Allergies as of 10/16/2020   No Known Allergies     Medication List    STOP taking these medications   aspirin 81 MG chewable tablet   Blood Pressure Monitor Kit   Provida DHA 16-16-1.25-110 MG Caps     TAKE these medications   acetaminophen 325 MG tablet Commonly known as: Tylenol Take  2 tablets (650 mg total) by mouth every 6 (six) hours as needed for mild pain, moderate pain, fever or headache.   amLODipine 5 MG tablet Commonly known as: NORVASC Take 1 tablet (5 mg total) by mouth daily.   coconut oil Oil Apply 1 application topically as needed (nipple pain).   ferrous sulfate 325 (65 FE) MG tablet Commonly known as: FerrouSul Take 1 tablet (325 mg total) by mouth every other day.   fluticasone 50 MCG/ACT nasal spray Commonly known as: FLONASE Place 2 sprays into both nostrils daily.   ibuprofen 600 MG tablet Commonly known as: ADVIL Take 1 tablet (600 mg total) by mouth every 8 (eight) hours as needed for moderate pain or cramping.   ipratropium 0.06 % nasal spray Commonly known as: Atrovent Place 2 sprays into both nostrils 4 (four) times daily.   Vitafol Ultra 29-0.6-0.4-200 MG Caps Take 1 capsule by mouth daily before breakfast.        Discharge home in stable condition Infant Feeding: breast and bottle Infant Disposition:home with mother Discharge instruction: per After Visit Summary and Postpartum booklet. Activity: Advance as tolerated. Pelvic rest for 6 weeks.  Diet: routine diet Future Appointments: Future Appointments  Date Time Provider Ethan  10/22/2020 10:20 AM [place] None  11/26/2020  9:30 AM Griffin Basil, MD [place] None   Follow up Visit: Message sent to [facility] on 10/15/20 to schedule PP appt.  Please schedule this patient for a In person postpartum visit in 6 weeks with the following provider: Any provider. Additional Postpartum F/U:BP check 1 week  High risk pregnancy complicated by: FGR, cHTN, anemia (Hgb 9.0 on admit to L&D >po iron) Delivery mode:  Vaginal, Spontaneous  Anticipated Birth Control:  Unsure (considering Nuvaring at [facility] appt)   Randa Ngo, MD OB Fellow, Faculty Practice 10/16/2020 6:44 AM

## 2020-10-16 ENCOUNTER — Other Ambulatory Visit (HOSPITAL_COMMUNITY): Payer: Self-pay | Admitting: Obstetrics and Gynecology

## 2020-10-16 MED ORDER — AMLODIPINE BESYLATE 5 MG PO TABS
5.0000 mg | ORAL_TABLET | Freq: Every day | ORAL | Status: DC
  Administered 2020-10-16: 5 mg via ORAL
  Filled 2020-10-16: qty 1

## 2020-10-16 MED ORDER — AMLODIPINE BESYLATE 5 MG PO TABS: 5.0000 mg | ORAL_TABLET | Freq: Every day | ORAL | 0 refills | Status: DC

## 2020-10-16 MED ORDER — FERROUS SULFATE 325 (65 FE) MG PO TABS: 325.0000 mg | ORAL_TABLET | ORAL | 1 refills | Status: DC

## 2020-10-16 MED ORDER — ACETAMINOPHEN 325 MG PO TABS
650.0000 mg | ORAL_TABLET | Freq: Four times a day (QID) | ORAL | Status: AC | PRN
Start: 2020-10-16 — End: ?

## 2020-10-16 MED ORDER — COCONUT OIL OIL: 1.0000 "application " | TOPICAL_OIL | 0 refills | Status: AC | PRN

## 2020-10-16 MED ORDER — IBUPROFEN 600 MG PO TABS: 600.0000 mg | ORAL_TABLET | Freq: Three times a day (TID) | ORAL | 0 refills | Status: DC | PRN

## 2020-10-16 MED FILL — FERROUS SULFATE 325 MG TAB: 325 (65 FE) | 6 days supply | Qty: 30 | Fill #0

## 2020-10-16 MED FILL — IBUPROFEN 600 MG TABLET: 600 | 10 days supply | Qty: 30 | Fill #0

## 2020-10-16 MED FILL — AMLODIPINE BESYLATE 5 MG TA: 5 | 45 days supply | Qty: 45 | Fill #0

## 2020-10-22 ENCOUNTER — Other Ambulatory Visit: Payer: Self-pay

## 2020-10-22 ENCOUNTER — Ambulatory Visit (INDEPENDENT_AMBULATORY_CARE_PROVIDER_SITE_OTHER): Payer: Medicaid Other

## 2020-10-22 VITALS — BP 150/100 | HR 69

## 2020-10-22 DIAGNOSIS — Z0130 Encounter for examination of blood pressure without abnormal findings: Secondary | ICD-10-CM

## 2020-10-22 NOTE — Progress Notes (Signed)
Patient was assessed and managed by nursing staff during this encounter. I have reviewed the chart and agree with the documentation and plan. I have also made any necessary editorial changes.  Catalina Antigua, MD 10/22/2020 12:00 PM

## 2020-10-22 NOTE — Progress Notes (Signed)
Subjective:  Courtney Lyons is a 30 y.o. female here for BP check.   Hypertension ROS: taking medications as instructed, no medication side effects noted, no TIA's, no chest pain on exertion, no dyspnea on exertion and no swelling of ankles.    Objective:  LMP 01/25/2020   Appearance alert, well appearing, and in no distress. General exam BP noted to be well controlled today in office.    Assessment:   Blood Pressure poorly controlled.   Plan:  The following changes are to be made: Increase amlodipine to 10 mg daily and return next week for bp check.Marland Kitchen

## 2020-10-29 ENCOUNTER — Other Ambulatory Visit: Payer: Self-pay

## 2020-10-29 ENCOUNTER — Ambulatory Visit: Payer: Medicaid Other

## 2020-10-29 VITALS — BP 127/86 | HR 68

## 2020-10-29 DIAGNOSIS — Z0130 Encounter for examination of blood pressure without abnormal findings: Secondary | ICD-10-CM

## 2020-10-29 NOTE — Progress Notes (Signed)
Patient was assessed and managed by nursing staff during this encounter. I have reviewed the chart and agree with the documentation and plan. I have also made any necessary editorial changes.  Catalina Antigua, MD 10/29/2020 12:57 PM

## 2020-10-29 NOTE — Progress Notes (Signed)
..  Subjective:  Courtney Lyons is a 30 y.o. female here for BP check.   Hypertension ROS: taking medications as instructed, no medication side effects noted, no TIA's, no chest pain on exertion, no dyspnea on exertion and no swelling of ankles.    Objective:  BP 127/86   Pulse 68   LMP 01/25/2020   Appearance alert, well appearing, and in no distress. General exam BP noted to be well controlled today in office.    Assessment:   Blood Pressure well controlled.   Plan:  Current treatment plan is effective, no change in therapy.. PP visit scheduled for 11-26-20

## 2020-11-26 ENCOUNTER — Ambulatory Visit (INDEPENDENT_AMBULATORY_CARE_PROVIDER_SITE_OTHER): Payer: Medicaid Other | Admitting: Obstetrics and Gynecology

## 2020-11-26 ENCOUNTER — Encounter: Payer: Self-pay | Admitting: Obstetrics and Gynecology

## 2020-11-26 ENCOUNTER — Other Ambulatory Visit: Payer: Self-pay

## 2020-11-26 DIAGNOSIS — Z3009 Encounter for other general counseling and advice on contraception: Secondary | ICD-10-CM | POA: Insufficient documentation

## 2020-11-26 DIAGNOSIS — O165 Unspecified maternal hypertension, complicating the puerperium: Secondary | ICD-10-CM

## 2020-11-26 LAB — POCT URINE PREGNANCY: Preg Test, Ur: NEGATIVE

## 2020-11-26 MED ORDER — AMLODIPINE BESYLATE 5 MG PO TABS: 5.0000 mg | ORAL_TABLET | Freq: Every day | ORAL | 2 refills | Status: AC

## 2020-11-26 MED ORDER — ETONOGESTREL-ETHINYL ESTRADIOL 0.12-0.015 MG/24HR VA RING: VAGINAL_RING | VAGINAL | 12 refills | Status: AC

## 2020-11-26 NOTE — Progress Notes (Signed)
Post Partum Visit Note  Courtney Lyons is a 30 y.o. [id number] female who presents for a postpartum visit. She is 6 weeks postpartum following a normal spontaneous vaginal delivery.  I have fully reviewed the prenatal and intrapartum course. The delivery was at 37.5 gestational weeks.  Anesthesia: epidural. Postpartum course has been unremarkable. Baby is doing well. Baby is feeding by both breast and bottle - Enfamil Pregestimil. Bleeding no bleeding. Bowel function is normal. Bladder function is normal. Patient is not sexually active. Contraception method is none. Postpartum depression screening: negative=0.   The pregnancy intention screening data noted above was reviewed. Potential methods of contraception were discussed. The patient elected to proceed with Vaginal Ring.    Edinburgh Postnatal Depression Scale - 11/26/20 0944      Edinburgh Postnatal Depression Scale:  In the Past 7 Days   I have been able to laugh and see the funny side of things. 0    I have looked forward with enjoyment to things. 0    I have blamed myself unnecessarily when things went wrong. 0    I have been anxious or worried for no good reason. 0    I have felt scared or panicky for no good reason. 0    Things have been getting on top of me. 0    I have been so unhappy that I have had difficulty sleeping. 0    I have felt sad or miserable. 0    I have been so unhappy that I have been crying. 0    The thought of harming myself has occurred to me. 0    Edinburgh Postnatal Depression Scale Total 0            The following portions of the patient's history were reviewed and updated as appropriate: allergies, current medications, past family history, past medical history, past social history, past surgical history and problem list.  Review of Systems Pertinent items noted in HPI and remainder of comprehensive ROS otherwise negative.    Objective:  LMP 01/25/2020    General:  alert, cooperative, no distress  and mildly obese   Breasts:  not performed  Lungs: clear to auscultation bilaterally  Heart:  regular rate and rhythm  Abdomen: soft, non-tender; bowel sounds normal; no masses,  no organomegaly   Vulva:  not evaluated  Vagina: not evaluated  Cervix:  not evaluated  Corpus: not examined  Adnexa:  not evaluated  Rectal Exam: Not performed.        Vitals:   11/26/20 0946 11/26/20 0953  BP: (!) 155/102 (!) 148/100  Pulse: 64 (!) 116   Assessment:   UPT Today is NEGATIVE.   normal postpartum exam. Pap smear not done at today's visit.   Plan:   Essential components of care per ACOG recommendations:  1.  Mood and well being: Patient with negative depression screening today. Reviewed local resources for support.  - Patient does not use tobacco.  - hx of drug use? No   2. Infant care and feeding:  -Patient currently breastmilk feeding? Yes Pt was doing breast and bottle but will be converting to bottle only. -Social determinants of health (SDOH) reviewed in EPIC. No concerns  3. Sexuality, contraception and birth spacing - Patient does not want a pregnancy in the next year.  Desired family size is 4 children.  - Reviewed forms of contraception in tiered fashion. Patient desired NuvaRing vaginal inserts today.   Did discuss mirena IUD and pt  s considering the device, pamphlet given - Discussed birth spacing of 18 months  4. Sleep and fatigue -Encouraged family/partner/community support of 4 hrs of uninterrupted sleep to help with mood and fatigue  5. Physical Recovery  - Discussed patients delivery and complications - Patient has urinary incontinence? No - Patient is safe to resume physical and sexual activity  6.  Health Maintenance - Last pap smear done 7/21 and was normal with negative HPV.   7. Chronic Disease  BP still elevated; however she has not been taking her amlodipine consistently, and she did not take it today, Refill given for medication and pt advised to  follow up with her PCP in 3-4 weeks - PCP follow up   Mariel Aloe, MD Center for [facility], [facility]

## 2020-11-26 NOTE — Patient Instructions (Signed)
Postpartum Hypertension Postpartum hypertension is high blood pressure that is higher than normal after childbirth. It usually starts within 1 to 2 days after delivery, but it can happen at any time for up to 6 weeks after delivery. For some women, medical treatment is required to prevent serious complications, such as seizures or stroke. What are the causes? The cause of this condition is not well understood. In some cases, the cause may not be known. Certain conditions may increase your risk. These include:  Hypertension that existed before pregnancy (chronic hypertension).  Hypertension that comes as a result of pregnancy (gestational hypertension).  Hypertensive disorders during pregnancy or seizures in women who have high blood pressure during pregnancy. These conditions are called preeclampsia and eclampsia.  A condition in which the liver, platelets, and red blood cells are damaged during pregnancy (HELLP syndrome).  Obesity.  Diabetes. What are the signs or symptoms? As with all types of hypertension, postpartum hypertension may not have any symptoms. Depending on how high your blood pressure is, you may experience:  Headaches. These may be mild, moderate, or severe. They may also be steady, constant, or sudden in onset (thunderclap headache).  Vision changes, such as blurry vision, flashing lights, or seeing spots.  Nausea and vomiting.  Pain in the upper right side of your abdomen.  Shortness of breath.  Difficulty breathing while lying down.  A decrease in the amount of urine that you pass. How is this diagnosed? This condition may be diagnosed based on the results of a physical exam, blood pressure measurements, and blood and urine tests. You may also have other tests, such as a CT scan or an MRI, to check for other problems of postpartum hypertension. How is this treated? If blood pressure is high enough to require treatment, your options may include:  Medicines to  reduce blood pressure (antihypertensives). Tell your health care provider if you are breastfeeding or if you plan to breastfeed. There are many antihypertensive medicines that are safe to take while breastfeeding.  Treating medical conditions that are causing hypertension.  Treating the complications of hypertension, such as seizures, stroke, or kidney problems. Your health care provider will also continue to monitor your blood pressure closely until it is within a safe range for you. Follow these instructions at home: Learn your goal blood pressure Two numbers make up your blood pressure. The first number is called systolic pressure. The second is called diastolic pressure. An example of a blood pressure reading is "120 over 80" (or 120/80). For most people, goal blood pressure is:  First number: below 140.  Second number: below 90. Your blood pressure is above normal even if only the top or bottom number is above normal. Know what to do before you take your blood pressure 30 minutes before you check your blood pressure:  Do not drink caffeine.  Do not drink alcohol.  Avoid food and drink.  Do not smoke.  Do not exercise. 5 minutes before you check your blood pressure:  Use the bathroom and urinate so that you have an empty bladder.  Sit quietly in a dining room chair. Do not sit in a soft couch or an armchair. Do not talk. Know how to take your blood pressure To check your blood pressure, follow the instructions in the manual that came with your blood pressure monitor. If you have a digital blood pressure monitor, the instructions may be as follows: 1. Sit up straight. 2. Place your feet on the floor. Do   not cross your ankles or legs. 3. Rest your left arm at the level of your heart. You may rest it on a table, desk, or chair. 4. Pull up your shirt sleeve. 5. Wrap the blood pressure cuff around the upper part of your left arm. The cuff should be 1 inch (2.5 cm) above your  elbow. It is best to wrap the cuff around bare skin. 6. Fit the cuff snugly around your arm. You should be able to place only one finger between the cuff and your arm. 7. Put the cord inside the groove of your elbow. 8. Press the power button. 9. Sit quietly while the cuff fills with air and loses air. 10. Write down the numbers on the screen. These are your blood pressure readings. 11. Wait 1-2 minutes and then repeat steps 1-10.   Record your blood pressure readings Follow your health care provider's instructions on how to record your blood pressure readings. If you were asked to use this form, follow these instructions:  Get one reading in the morning (a.m.) before you take any medicines.  Get one reading in the evening (p.m.) before supper.  Take at least 2 readings with each blood pressure check. This makes sure the results are correct. Wait 1-2 minutes between measurements.  Write down the results in the spaces on this form. Date: _______________________  a.m. _____________________(1st reading) _____________________(2nd reading)  p.m. _____________________(1st reading) _____________________(2nd reading) Date: _______________________  a.m. _____________________(1st reading) _____________________(2nd reading)  p.m. _____________________(1st reading) _____________________(2nd reading) Date: _______________________  a.m. _____________________(1st reading) _____________________(2nd reading)  p.m. _____________________(1st reading) _____________________(2nd reading) Date: _______________________  a.m. _____________________(1st reading) _____________________(2nd reading)  p.m. _____________________(1st reading) _____________________(2nd reading) Date: _______________________  a.m. _____________________(1st reading) _____________________(2nd reading)  p.m. _____________________(1st reading) _____________________(2nd reading) General instructions  Take over-the-counter and  prescription medicines only as told by your health care provider.  Do not use any products that contain nicotine or tobacco. These products include cigarettes, chewing tobacco, and vaping devices, such as e-cigarettes. If you need help quitting, ask your health care provider.  Check your blood pressure as often as recommended by your health care provider.  Return to your normal activities as told by your health care provider. Ask your health care provider what activities are safe for you.  Keep all follow-up visits. This is important. Contact a health care provider if:  You have new symptoms, such as: ? A headache that does not get better. ? Dizziness. ? Visual changes. ? Nausea and vomiting. Get help right away if:  You develop difficulty breathing.  You have chest pain.  You faint.  You have any symptoms of a stroke. "BE FAST" is an easy way to remember the main warning signs of a stroke: ? B - Balance. Signs are dizziness, sudden trouble walking, or loss of balance. ? E - Eyes. Signs are trouble seeing or a sudden change in vision. ? F - Face. Signs are sudden weakness or numbness of the face, or the face or eyelid drooping on one side. ? A - Arms. Signs are weakness or numbness in an arm. This happens suddenly and usually on one side of the body. ? S - Speech. Signs are sudden trouble speaking, slurred speech, or trouble understanding what people say. ? T - Time. Time to call emergency services. Write down what time symptoms started.  You have other signs of a stroke, such as: ? A sudden, severe headache with no known cause. ? Nausea or vomiting. ? Seizure. These symptoms   may represent a serious problem that is an emergency. Do not wait to see if the symptoms will go away. Get medical help right away. Call your local emergency services (911 in the [place] Do not drive yourself to the hospital. Summary  Postpartum hypertension is high blood pressure that remains higher than  normal after childbirth.  For some women, medical treatment is required to prevent serious complications, such as seizures or stroke.  Follow your health care provider's instructions on how to record your blood pressure readings.  Keep all follow-up visits. This is important. This information is not intended to replace advice given to you by your health care provider. Make sure you discuss any questions you have with your health care provider. Document Revised: 06/30/2020 Document Reviewed: 06/30/2020 Elsevier Patient Education  2021 [employer]  

## 2021-01-08 IMAGING — US US MFM FETAL BPP W/O NON-STRESS
1 series · 13 of 28 positions shown · non-contrast
Comparison: none

[Series 1: us mfm fetal bpp w/o non-stress · 47 acquisitions, 13 frames shown]
[im 2/47]
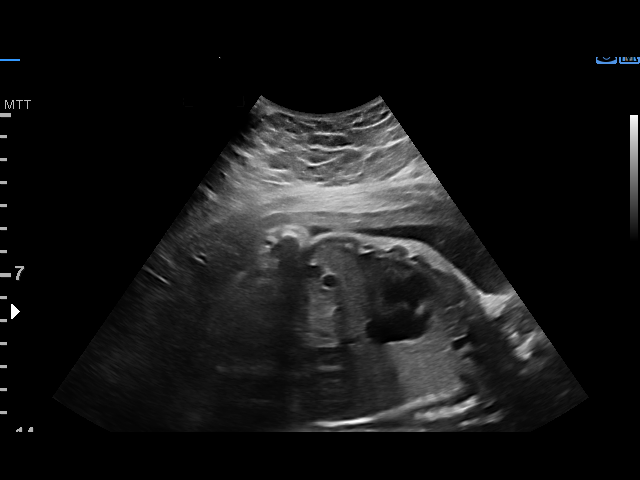
[im 6/47]
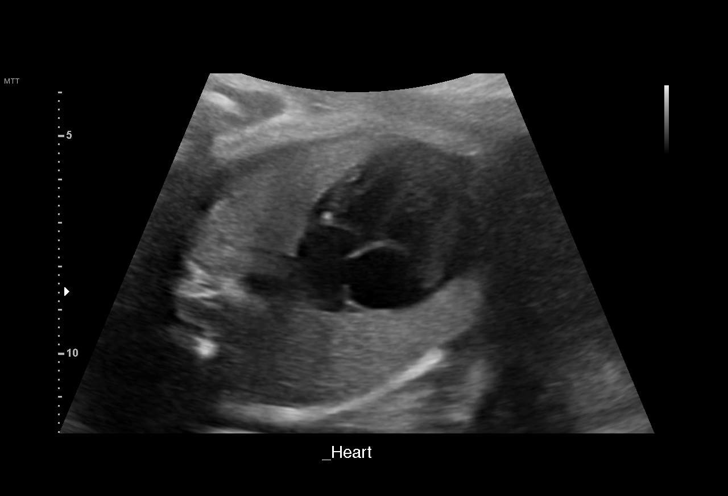
[im 9/47]
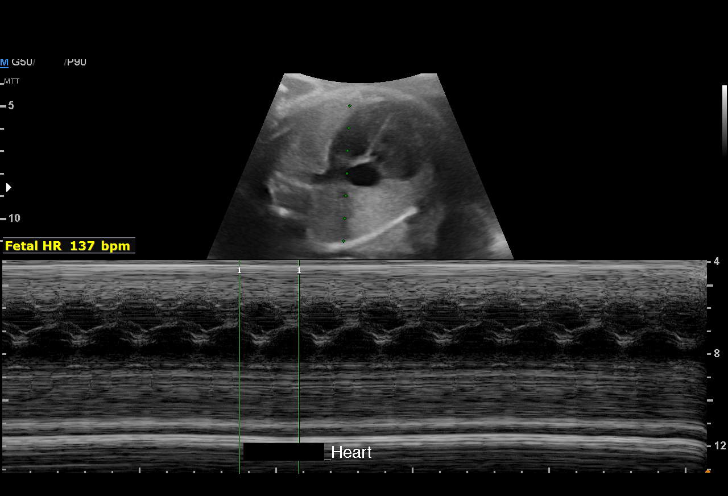
[im 12/47]
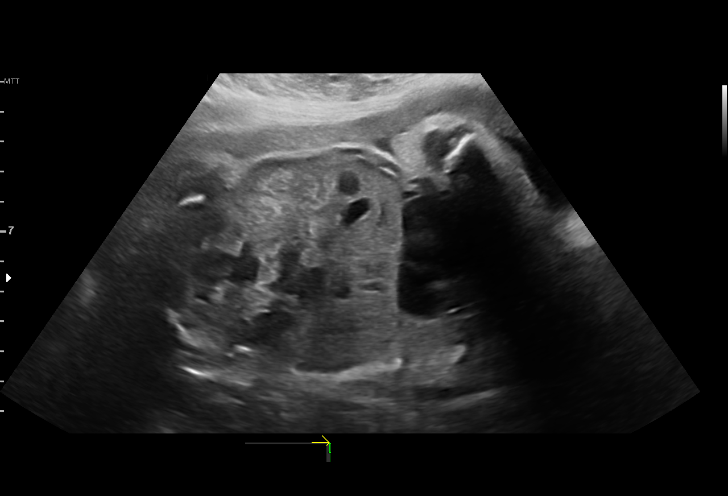
[im 16/47]
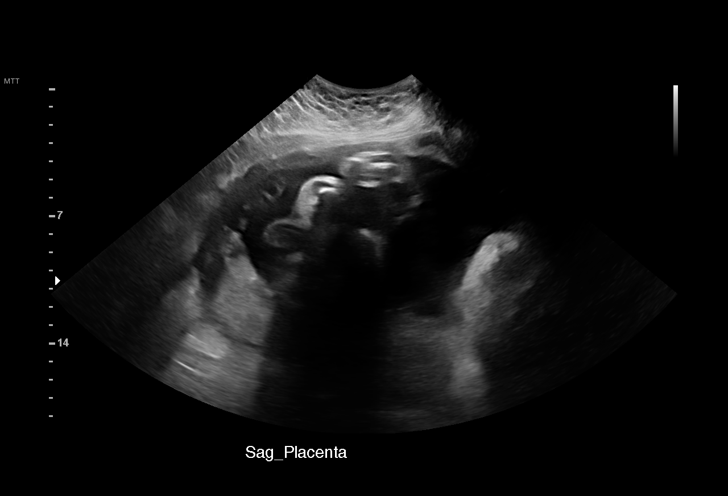
[im 19/47]
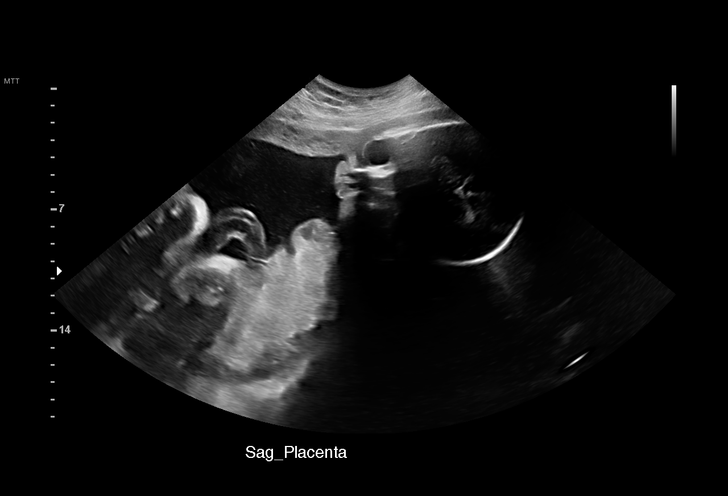
[im 24/47]
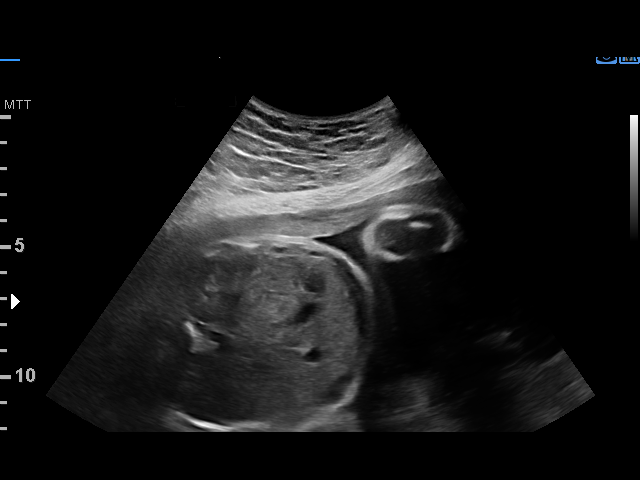
[im 28/47]
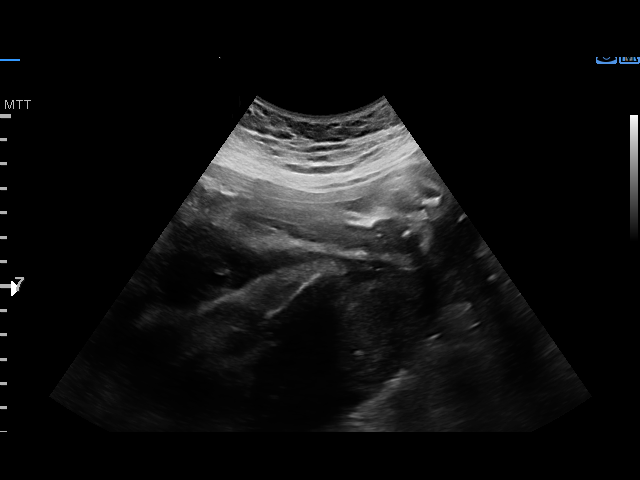
[im 31/47]
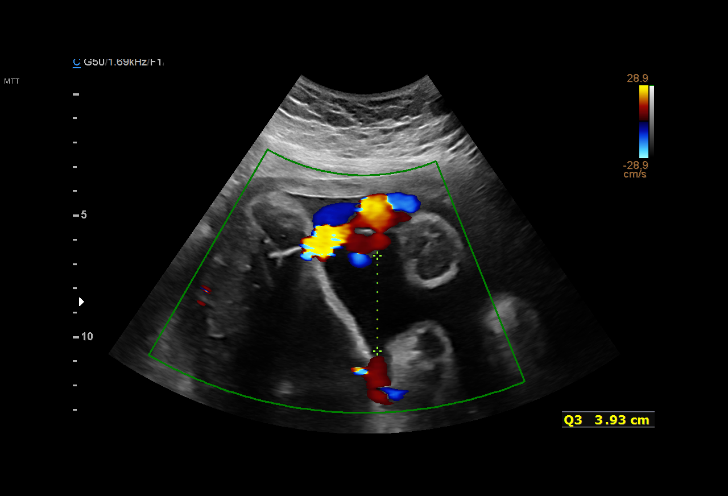
[im 35/47]
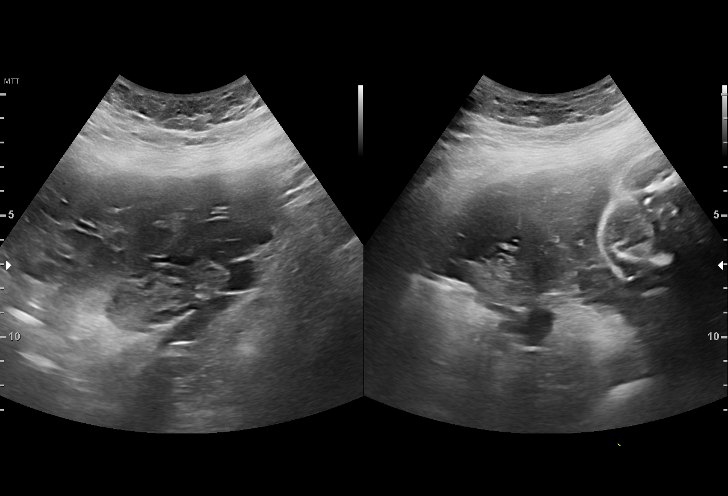
[im 38/47]
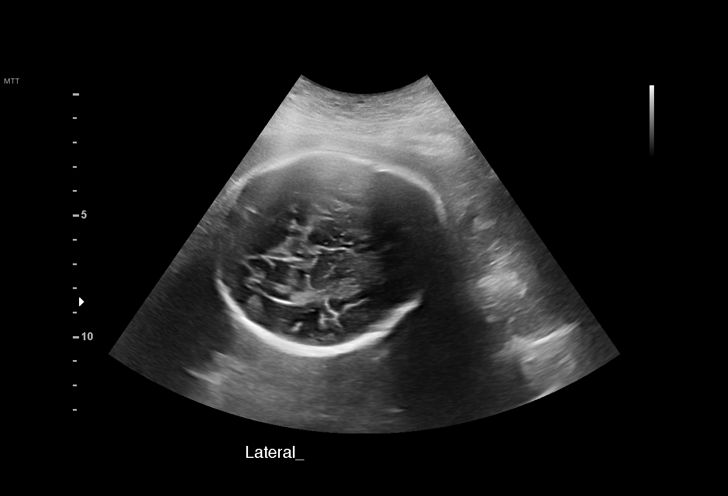
[im 41/47]
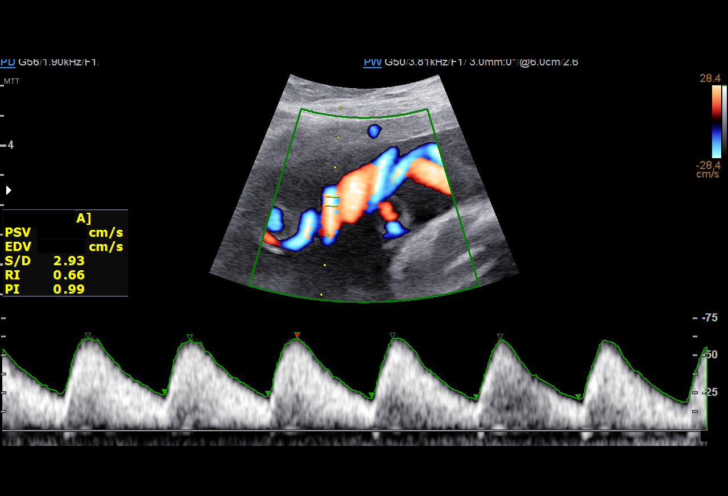
[im 45/47]
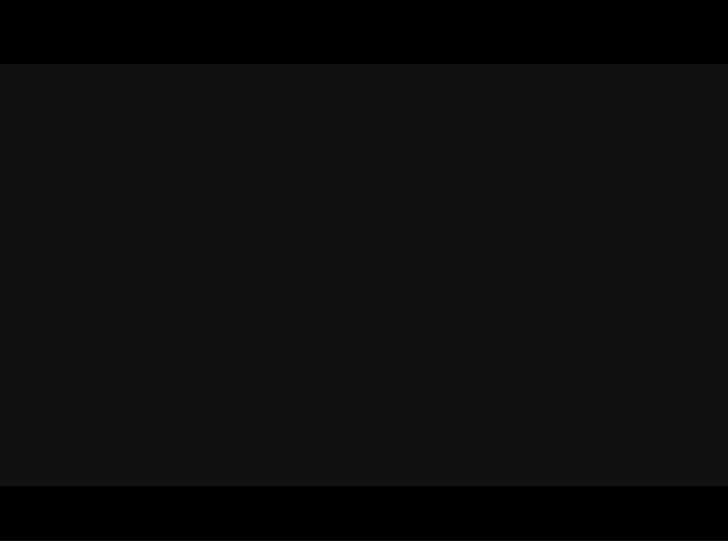

[13 of 28 positions shown; findings below may reference images not displayed]

Indications

 Maternal care for known or suspected poor
 fetal growth, third trimester, fetus 1 IUGR
 Hypertension - Chronic/Pre-existing (No
 Meds)
 33 weeks gestation of pregnancy
 Poor obstetric history: Previous fetal growth
 restriction (FGR)
 Obesity complicating pregnancy, third
 trimester (BMI 37)
 Genetic carrier (Silent Ichigo Niieto)
 Low Risk NIPS
Fetal Evaluation

 Num Of Fetuses:         1
 Fetal Heart Rate(bpm):  137
 Cardiac Activity:       Observed
 Presentation:           Cephalic
 Placenta:               Posterior
 P. Cord Insertion:      Previously Visualized

 Amniotic Fluid
 AFI FV:      Within normal limits

 AFI Sum(cm)     %Tile       Largest Pocket(cm)
 11.7            31
 RUQ(cm)       RLQ(cm)       LUQ(cm)        LLQ(cm)

Biophysical Evaluation

 Amniotic F.V:   Pocket => 2 cm             F. Tone:        Observed
 F. Movement:    Observed                   Score:          [DATE]
 F. Breathing:   Observed
OB History

 Gravidity:    18        Term:   3
 Living:       3
Gestational Age

 LMP:           33w 4d        Date:  01/25/20                 EDD:   10/31/20
 Best:          33w 4d     Det. By:  LMP  (01/25/20)          EDD:   10/31/20
Anatomy

 Cranium:               Previously seen        Aortic Arch:            Previously seen
 Cavum:                 Previously seen        Ductal Arch:            Previously seen
 Ventricles:            Appears normal         Diaphragm:              Appears normal
 Choroid Plexus:        Previously seen        Stomach:                Appears normal, left
                                                                       sided
 Cerebellum:            Previously seen        Abdomen:                Previously seen
 Posterior Fossa:       Previously seen        Abdominal Wall:         Previously seen
 Nuchal Fold:           Previously seen        Cord Vessels:           Previously seen
 Face:                  Orbits and profile     Kidneys:                Appear normal
                        previously seen
 Lips:                  Previously seen        Bladder:                Appears normal
 Thoracic:              Previously seen        Spine:                  Previously seen
 Heart:                 Pericardial            Upper Extremities:      Previously seen
                        effusion
 RVOT:                  Previously seen        Lower Extremities:      Previously seen
 LVOT:                  Previously seen

 Other:  Fetus appears to be a male. Technically difficult due to maternal
         habitus and fetal position.
Doppler - Fetal Vessels

 Umbilical Artery
  S/D     %tile      RI    %tile                             ADFV    RDFV
  3.24       84    0.69       86                                No      No

Cervix Uterus Adnexa

 Cervix
 Not visualized (advanced GA >08wks)

 Uterus
 No abnormality visualized.
 Right Ovary
 Within normal limits. No adnexal mass visualized.

 Left Ovary
 Within normal limits. No adnexal mass visualized.

 Cul De Sac
 No free fluid seen.

 Adnexa
 No abnormality visualized.
Comments

 This patient was seen due to an IUGR fetus.  She denies any
 problems since her last exam.  She reports feeling vigorous
 fetal movements throughout the day.
 A biophysical profile performed today was [DATE].
 There was normal amniotic fluid noted on today's ultrasound
 exam.
 Doppler studies of the umbilical arteries performed due to
 fetal growth restriction showed a normal S/D ratio of 3.24.
 There were no signs of absent or reversed end-diastolic flow
 noted today.
 A small amount of pericardial effusion was noted around the
 fetal heart.  This finding may be related to the fetal position or
 may be physiologic.  We will continue to assess this finding
 during her future ultrasound exams.
 Another biophysical profile, umbilical artery Doppler study,
 and growth scan was scheduled in 1 week.

## 2021-01-13 IMAGING — US US MFM FETAL BPP W/ NON-STRESS
1 series · 13 of 28 positions shown · non-contrast
Comparison: none

[Series 1: us mfm fetal bpp w/ non-stress · 69 acquisitions, 13 frames shown]
[im 3/69]
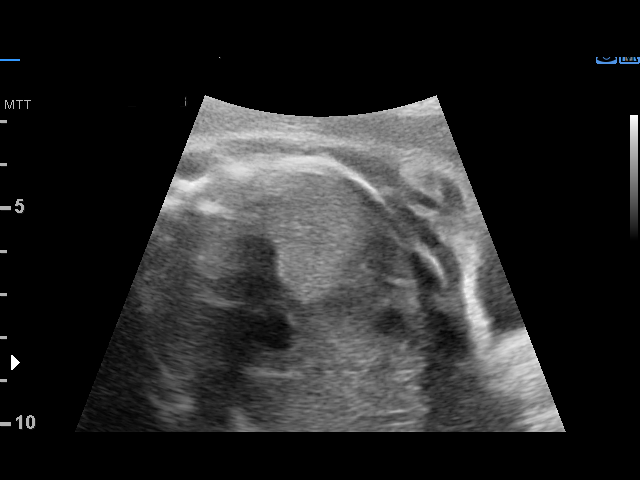
[im 8/69]
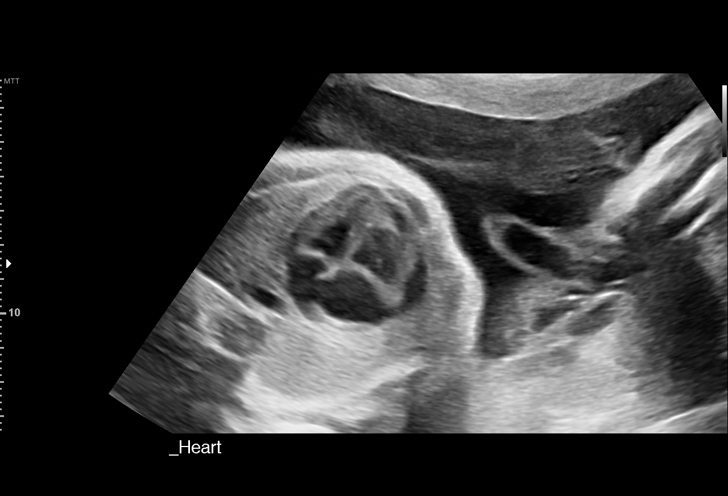
[im 13/69]
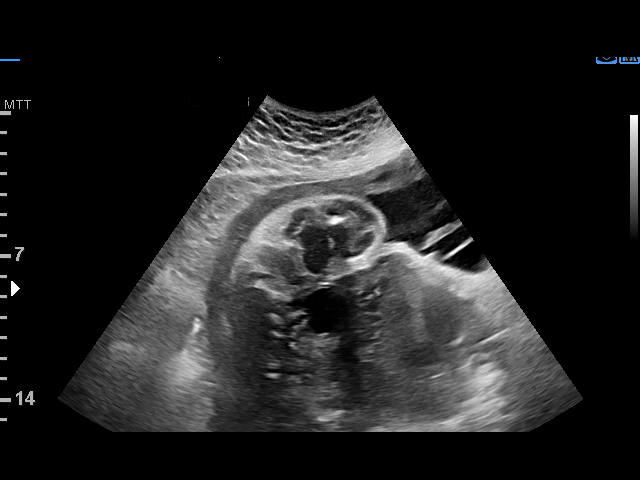
[im 18/69]
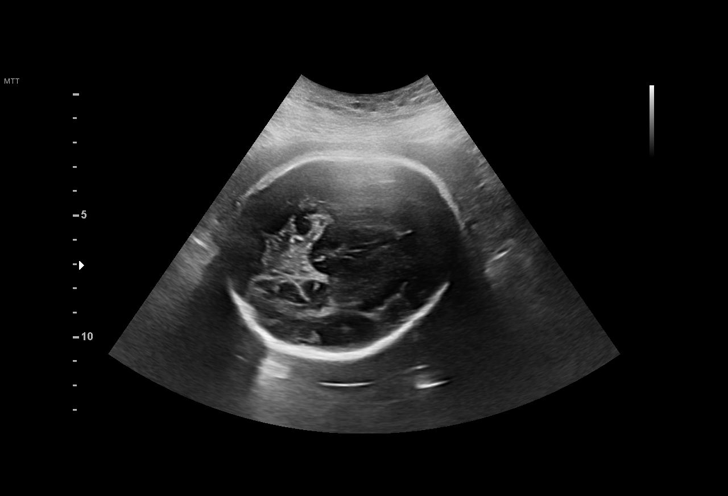
[im 23/69]
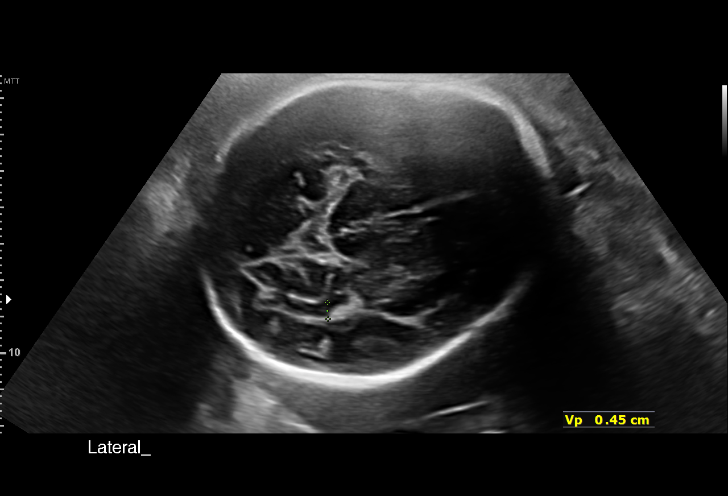
[im 28/69]
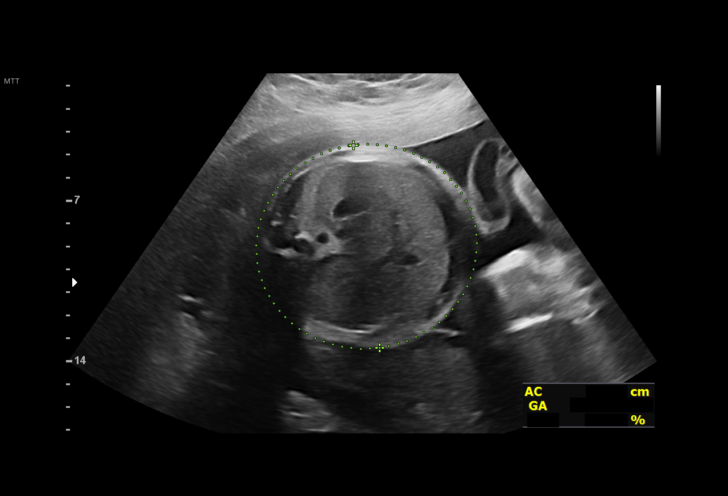
[im 36/69]
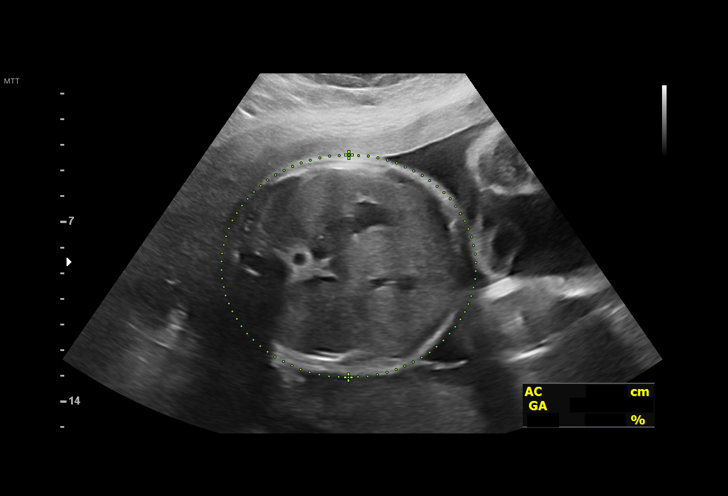
[im 41/69]
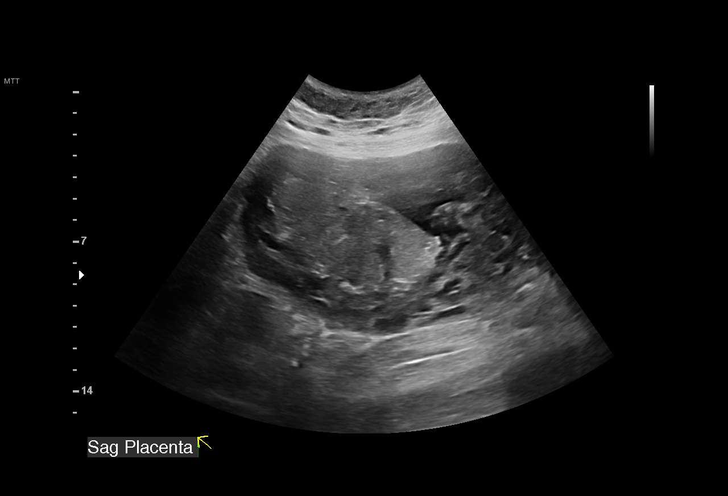
[im 46/69]
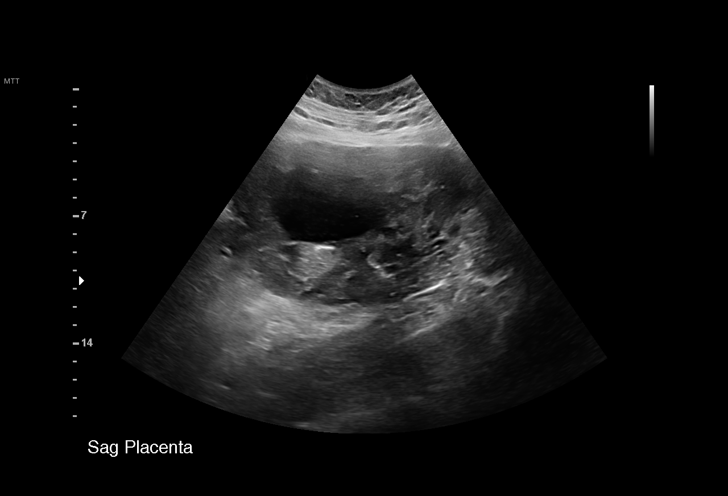
[im 51/69]
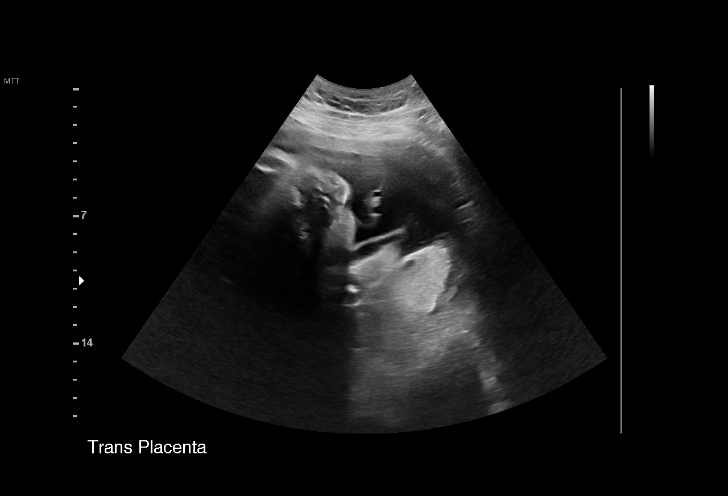
[im 56/69]
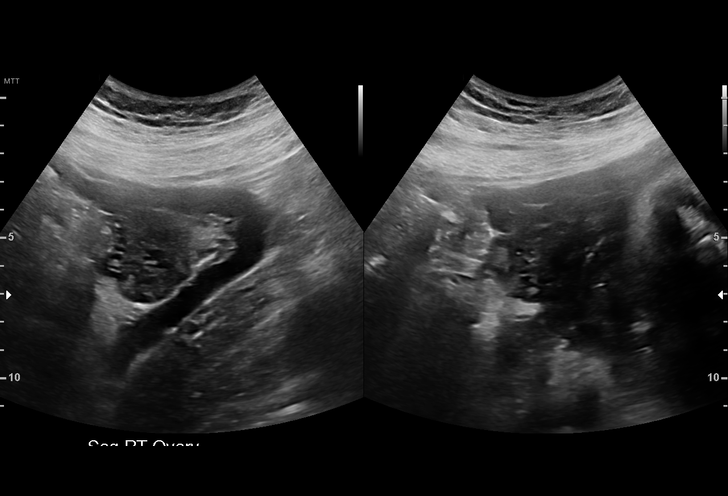
[im 61/69]
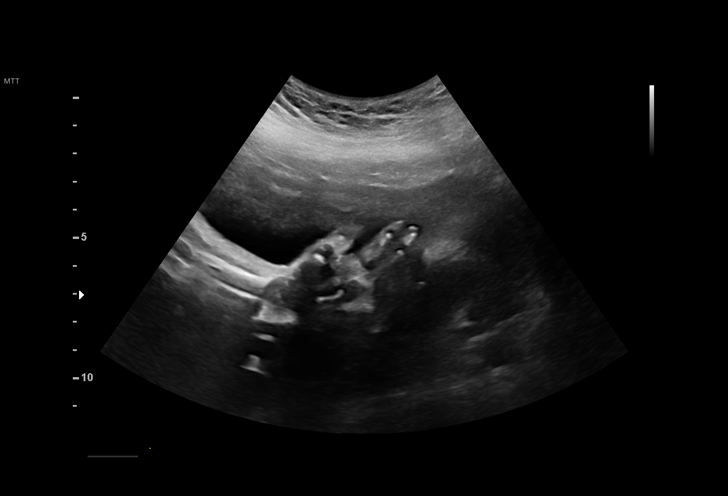
[im 66/69]
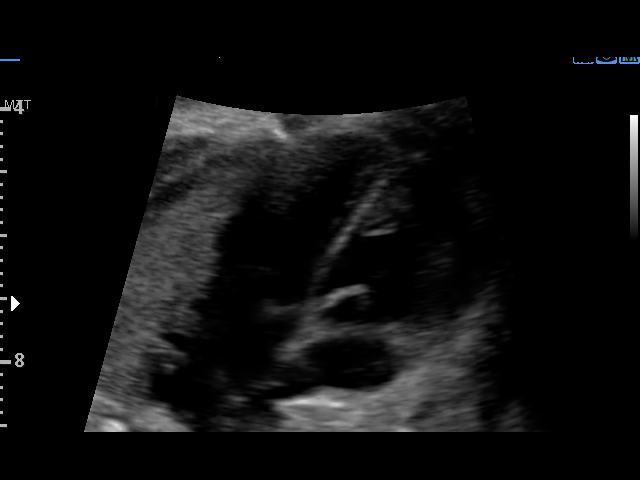

[13 of 28 positions shown; findings below may reference images not displayed]

W/NONSTRESS

Indications

 34 weeks gestation of pregnancy
 Maternal care for known or suspected poor
 fetal growth, third trimester, fetus 1 IUGR
 Hypertension - Chronic/Pre-existing (No
 Meds)
 Poor obstetric history: Previous fetal growth
 restriction (FGR)
 Obesity complicating pregnancy, third
 trimester (BMI 37)
 Genetic carrier (Silent Nard Nesru)
 Low Risk NIPS
Fetal Evaluation

 Num Of Fetuses:         1
 Fetal Heart Rate(bpm):  136
 Cardiac Activity:       Observed
 Presentation:           Cephalic
 Placenta:               Posterior
 P. Cord Insertion:      Previously Visualized
 Amniotic Fluid
 AFI FV:      Within normal limits

 AFI Sum(cm)     %Tile       Largest Pocket(cm)
 16.6            60

 RUQ(cm)       RLQ(cm)       LUQ(cm)        LLQ(cm)

Biophysical Evaluation

 Amniotic F.V:   Pocket => 2 cm             F. Tone:        Observed
 F. Movement:    Observed                   N.S.T:          Reactive
 F. Breathing:   Observed                   Score:          [DATE]
Biometry

 BPD:      82.1  mm     G. Age:  33w 0d         15  %    CI:        79.86   %    70 - 86
                                                         FL/HC:      21.0   %    19.4 -
 HC:      290.3  mm     G. Age:  32w 0d        < 1  %    HC/AC:      0.99        0.96 -
 AC:       292   mm     G. Age:  33w 1d         23  %    FL/BPD:     74.2   %    71 - 87
 FL:       60.9  mm     G. Age:  31w 4d        1.6  %    FL/AC:      20.9   %    20 - 24
 CER:      44.3  mm     G. Age:  34w 4d         34  %
 LV:        4.5  mm
 CM:        6.3  mm

 Est. FW:    3998  gm      4 lb 7 oz      8  %
OB History

 Gravidity:    18        Term:   3
 Living:       3
Gestational Age

 LMP:           34w 2d        Date:  01/25/20                 EDD:   10/31/20
 U/S Today:     32w 3d                                        EDD:   11/13/20
 Best:          34w 2d     Det. By:  LMP  (01/25/20)          EDD:   10/31/20
Anatomy

 Cranium:               Previously seen        Aortic Arch:            Previously seen
 Cavum:                 Previously seen        Ductal Arch:            Previously seen
 Ventricles:            Appears normal         Diaphragm:              Appears normal
 Choroid Plexus:        Previously seen        Stomach:                Appears normal, left
                                                                       sided
 Cerebellum:            Previously seen        Abdomen:                Previously seen
 Posterior Fossa:       Previously seen        Abdominal Wall:         Previously seen
 Nuchal Fold:           Previously seen        Cord Vessels:           Previously seen
 Face:                  Orbits and profile     Kidneys:                Appear normal
                        previously seen
 Lips:                  Previously seen        Bladder:                Appears normal
 Thoracic:              Previously seen        Spine:                  Previously seen
 Heart:                 Pericardial            Upper Extremities:      Previously seen
                        effusion
 RVOT:                  Previously seen        Lower Extremities:      Previously seen
 LVOT:                  Previously seen

 Other:  Male gender previously seen. Technically difficult due to maternal
         habitus and fetal position.
Doppler - Fetal Vessels

 Umbilical Artery
  S/D     %tile      RI    %tile                             ADFV    RDFV
  3.85       97    0.74       96                                No      No

Cervix Uterus Adnexa

 Cervix
 Not visualized (advanced GA >35wks)

 Uterus
 No abnormality visualized.

 Right Ovary
 Within normal limits. No adnexal mass visualized.

 Left Ovary
 Within normal limits. No adnexal mass visualized.

 Cul De Sac
 No free fluid seen.

 Adnexa
 No abnormality visualized.
Impression

 Fetal growth restriction.  Patient return for fetal growth
 assessment and antenatal testing.
 On today's ultrasound, the estimated fetal weight is at the 8
 percentile.  Interval weight gain 544 g over 3 weeks.Amniotic
 fluid is normal and good fetal activity is seen .  Umbilical
 artery Doppler showed increased S/D ratio.  NST is reactive.
 BPP [DATE].

 Patient has a date for induction of labor to be performed on
 10/14/2020.

 Blood pressure today at our office is 139/86 mmHg.
Recommendations

 Weekly BPP, NST and UA Doppler till delivery.
                 Geng, Gedoo
